# Patient Record
Sex: Female | Born: 1990 | Race: Black or African American | Hispanic: No | Marital: Single | Smoking: Never smoker
Health system: Southern US, Community
[De-identification: ages and names within clinical notes are randomized; demographics above are authoritative.]

## PROBLEM LIST (undated history)

## (undated) ENCOUNTER — Inpatient Hospital Stay (HOSPITAL_COMMUNITY): Payer: Self-pay

## (undated) DIAGNOSIS — J302 Other seasonal allergic rhinitis: Secondary | ICD-10-CM

## (undated) DIAGNOSIS — G43909 Migraine, unspecified, not intractable, without status migrainosus: Secondary | ICD-10-CM

## (undated) DIAGNOSIS — B977 Papillomavirus as the cause of diseases classified elsewhere: Secondary | ICD-10-CM

## (undated) HISTORY — DX: Papillomavirus as the cause of diseases classified elsewhere: B97.7

## (undated) HISTORY — PX: BACK SURGERY: SHX140

---
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
## 2012-01-19 ENCOUNTER — Emergency Department (HOSPITAL_COMMUNITY): Payer: Medicaid Other

## 2012-01-19 ENCOUNTER — Emergency Department (HOSPITAL_COMMUNITY)
Admission: EM | Admit: 2012-01-19 | Discharge: 2012-01-19 | Disposition: A | Discharge status: Home / Self Care | Payer: Medicaid Other | Attending: Emergency Medicine | Admitting: Emergency Medicine

## 2012-01-19 ENCOUNTER — Encounter (HOSPITAL_COMMUNITY): Payer: Self-pay | Admitting: Emergency Medicine

## 2012-01-19 DIAGNOSIS — R51 Headache: Secondary | ICD-10-CM

## 2012-01-19 DIAGNOSIS — J45909 Unspecified asthma, uncomplicated: Secondary | ICD-10-CM | POA: Insufficient documentation

## 2012-01-19 DIAGNOSIS — S0990XA Unspecified injury of head, initial encounter: Secondary | ICD-10-CM | POA: Insufficient documentation

## 2012-01-19 DIAGNOSIS — M549 Dorsalgia, unspecified: Secondary | ICD-10-CM

## 2012-01-19 DIAGNOSIS — IMO0002 Reserved for concepts with insufficient information to code with codable children: Secondary | ICD-10-CM

## 2012-01-19 DIAGNOSIS — T7491XA Unspecified adult maltreatment, confirmed, initial encounter: Secondary | ICD-10-CM | POA: Insufficient documentation

## 2012-01-19 LAB — PREGNANCY, URINE: Preg Test, Ur: NEGATIVE

## 2012-01-19 MED ORDER — OXYCODONE-ACETAMINOPHEN 5-325 MG PO TABS
2.0000 | ORAL_TABLET | Freq: Once | ORAL | Status: AC
  Administered 2012-01-19: 2 via ORAL
  Filled 2012-01-19: qty 2

## 2012-01-19 MED ORDER — IBUPROFEN 600 MG PO TABS: 600.0000 mg | ORAL_TABLET | Freq: Four times a day (QID) | ORAL | Status: AC | PRN

## 2012-01-19 MED ORDER — OXYCODONE-ACETAMINOPHEN 5-325 MG PO TABS: 2.0000 | ORAL_TABLET | Freq: Four times a day (QID) | ORAL | Status: AC | PRN

## 2012-01-19 MED ORDER — HYDROMORPHONE HCL PF 1 MG/ML IJ SOLN
1.0000 mg | Freq: Once | INTRAMUSCULAR | Status: AC
  Administered 2012-01-19: 1 mg via INTRAMUSCULAR
  Filled 2012-01-19: qty 1

## 2012-01-19 MED ORDER — TETANUS-DIPHTH-ACELL PERTUSSIS 5-2.5-18.5 LF-MCG/0.5 IM SUSP
0.5000 mL | Freq: Once | INTRAMUSCULAR | Status: AC
  Administered 2012-01-19: 0.5 mL via INTRAMUSCULAR
  Filled 2012-01-19: qty 0.5

## 2012-01-19 MED ORDER — IBUPROFEN 200 MG PO TABS
600.0000 mg | ORAL_TABLET | Freq: Once | ORAL | Status: AC
  Administered 2012-01-19: 600 mg via ORAL
  Filled 2012-01-19: qty 3
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
## 2012-01-20 NOTE — ED Provider Notes (Signed)
I saw and evaluated the patient, reviewed the resident's note and I agree with the findings and plan.   Dione Booze, MD 01/20/12 1459

## 2012-03-11 ENCOUNTER — Other Ambulatory Visit: Payer: Self-pay | Admitting: Obstetrics

## 2012-08-14 LAB — PROCEDURE REPORT - SCANNED: Pap: NEGATIVE

## 2013-05-01 ENCOUNTER — Encounter (HOSPITAL_COMMUNITY): Payer: Self-pay | Admitting: Emergency Medicine

## 2013-05-01 ENCOUNTER — Emergency Department (HOSPITAL_COMMUNITY)
Admission: EM | Admit: 2013-05-01 | Discharge: 2013-05-01 | Disposition: A | Discharge status: Home / Self Care | Payer: Medicaid Other | Attending: Emergency Medicine | Admitting: Emergency Medicine

## 2013-05-01 ENCOUNTER — Emergency Department (HOSPITAL_COMMUNITY): Payer: Medicaid Other

## 2013-05-01 DIAGNOSIS — S59919A Unspecified injury of unspecified forearm, initial encounter: Secondary | ICD-10-CM | POA: Insufficient documentation

## 2013-05-01 DIAGNOSIS — Z79899 Other long term (current) drug therapy: Secondary | ICD-10-CM | POA: Insufficient documentation

## 2013-05-01 DIAGNOSIS — R209 Unspecified disturbances of skin sensation: Secondary | ICD-10-CM | POA: Insufficient documentation

## 2013-05-01 DIAGNOSIS — S59909A Unspecified injury of unspecified elbow, initial encounter: Secondary | ICD-10-CM | POA: Insufficient documentation

## 2013-05-01 DIAGNOSIS — J45909 Unspecified asthma, uncomplicated: Secondary | ICD-10-CM | POA: Insufficient documentation

## 2013-05-01 DIAGNOSIS — M25531 Pain in right wrist: Secondary | ICD-10-CM

## 2013-05-01 DIAGNOSIS — S6990XA Unspecified injury of unspecified wrist, hand and finger(s), initial encounter: Secondary | ICD-10-CM | POA: Insufficient documentation

## 2013-05-01 MED ORDER — MORPHINE SULFATE 4 MG/ML IJ SOLN
4.0000 mg | Freq: Once | INTRAMUSCULAR | Status: AC
  Administered 2013-05-01: 4 mg via INTRAMUSCULAR
  Filled 2013-05-01: qty 1

## 2013-05-01 MED ORDER — PROMETHAZINE HCL 25 MG PO TABS: 25.0000 mg | ORAL_TABLET | Freq: Four times a day (QID) | ORAL | Status: DC | PRN

## 2013-05-01 MED ORDER — ONDANSETRON 8 MG PO TBDP
8.0000 mg | ORAL_TABLET | Freq: Once | ORAL | Status: AC
  Administered 2013-05-01: 8 mg via ORAL
  Filled 2013-05-01: qty 1

## 2013-05-01 MED ORDER — HYDROCODONE-ACETAMINOPHEN 5-325 MG PO TABS: 2.0000 | ORAL_TABLET | Freq: Four times a day (QID) | ORAL | Status: DC | PRN
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
## 2013-05-02 NOTE — ED Provider Notes (Signed)
Medical screening examination/treatment/procedure(s) were performed by non-physician practitioner and as supervising physician I was immediately available for consultation/collaboration.   Laray Anger, DO 05/02/13 [phone]

## 2013-07-22 ENCOUNTER — Emergency Department (HOSPITAL_COMMUNITY)
Admission: EM | Admit: 2013-07-22 | Discharge: 2013-07-23 | Disposition: A | Discharge status: Home / Self Care | Payer: Self-pay | Attending: Emergency Medicine | Admitting: Emergency Medicine

## 2013-07-22 ENCOUNTER — Encounter (HOSPITAL_COMMUNITY): Payer: Self-pay | Admitting: Emergency Medicine

## 2013-07-22 DIAGNOSIS — H538 Other visual disturbances: Secondary | ICD-10-CM | POA: Insufficient documentation

## 2013-07-22 DIAGNOSIS — J45909 Unspecified asthma, uncomplicated: Secondary | ICD-10-CM | POA: Insufficient documentation

## 2013-07-22 DIAGNOSIS — L03213 Periorbital cellulitis: Secondary | ICD-10-CM

## 2013-07-22 DIAGNOSIS — H05019 Cellulitis of unspecified orbit: Secondary | ICD-10-CM | POA: Insufficient documentation

## 2013-07-22 MED ORDER — VANCOMYCIN HCL IN DEXTROSE 1-5 GM/200ML-% IV SOLN
1000.0000 mg | Freq: Once | INTRAVENOUS | Status: AC
  Administered 2013-07-23: 1000 mg via INTRAVENOUS
  Filled 2013-07-22: qty 200

## 2013-07-22 MED ORDER — TETRACAINE HCL 0.5 % OP SOLN
2.0000 [drp] | Freq: Once | OPHTHALMIC | Status: AC
  Administered 2013-07-22: 2 [drp] via OPHTHALMIC
  Filled 2013-07-22: qty 2

## 2013-07-22 MED ORDER — DEXTROSE 5 % IV SOLN
1.0000 g | Freq: Once | INTRAVENOUS | Status: AC
  Administered 2013-07-23: 1 g via INTRAVENOUS
  Filled 2013-07-22: qty 10

## 2013-07-22 NOTE — ED Notes (Signed)
Patient adamantly refuses IV abx. Agrees to stay for CT scan but nothing else. Patient will not be transferred to another bed and will remain in FT room #4. Heather, PA notified and at bedside. Explained to patient that she could possibly lose her vision to the right eye if she refuses treatment. Patient still refuses IV abx.

## 2013-07-22 NOTE — ED Notes (Signed)
Updated patient on plan of care. Patient states that she is tired of waiting is about to "go off on your ass" referring to RN. Apologized to patient about delay and informed her that she should stay to have the ordered CT scan so that they can determine what's wrong with her eye. Patient continues to speak obscenities and RN left the room.

## 2013-07-22 NOTE — ED Provider Notes (Signed)
This chart was scribed for Magnus Sinning PA-C, a non-physician practitioner working with Vida Roller, MD by Lewanda Rife, ED Scribe. This patient was seen in room TR04C/TR04C and the patient's care was started at 2246.    CSN: [id number]     Arrival date & time 07/22/13  2020 History   First MD Initiated Contact with Patient 07/22/13 2120     Chief Complaint  Patient presents with  . Eye Problem   (Consider location/radiation/quality/duration/timing/severity/associated sxs/prior Treatment) The history is provided by the patient.   HPI Comments: Lisa Hickman is a 22 y.o. female who presents to the Emergency Department complaining of significant right periorbital eye swelling onset this morning. Reports associated intermittent moderate right eye pain. Denies any injury or trauma of the eye.   Denies any discharge from the eye.  Denies use of contacts or glasses. Denies associated visual disturbances, fever, chills, difficulty breathing, dysphagia, known foreign body of the eye.  Reports right eye pain is exacerbated by touch and EOMs. Denies hx of the same. Reports taking 2 Benadryl tablets and 1 Tylenol this morning with no relief of symptoms.  Denies recent sinus infection.     Past Medical History  Diagnosis Date  . Asthma    History reviewed. No pertinent past surgical history. No family history on file. History  Substance Use Topics  . Smoking status: Never Smoker   . Smokeless tobacco: Not on file  . Alcohol Use: No   OB History   Grav Para Term Preterm Abortions TAB SAB Ect Mult Living                 Review of Systems  Eyes: Positive for visual disturbance. Negative for discharge.       Eye swelling    A complete 10 system review of systems was obtained and all systems are negative except as noted in the HPI and PMH.   Allergies  Review of patient's allergies indicates no known allergies.  Home Medications   Current Outpatient Rx  Name  Route  Sig   Dispense  Refill  . medroxyPROGESTERone (DEPO-PROVERA) 150 MG/ML injection   Intramuscular   Inject 150 mg into the muscle every 3 (three) months.          BP 115/73  Pulse 90  Temp(Src) 98.2 F (36.8 C) (Oral)  Resp 18  Ht 5\' 7"  (1.702 m)  Wt 220 lb (99.791 kg)  BMI 34.45 kg/m2  SpO2 100% Physical Exam  Nursing note and vitals reviewed. Constitutional: She is oriented to person, place, and time. She appears well-developed and well-nourished. No distress.  HENT:  Head: Normocephalic and atraumatic.  Eyes: Conjunctivae and EOM are normal. Pupils are equal, round, and reactive to light. Right eye exhibits no discharge and no exudate. Right conjunctiva is not injected. Right conjunctiva has no hemorrhage. Left conjunctiva is not injected. Left conjunctiva has no hemorrhage.  Right eye pain with lateral EOM Significant periorbital edema of the right eye No proptosis  Ocular pressure  Right eye: 31, repeat 27 Left eye: 21   Visual acuity:  Uncorrected  Right eye: 20/50 Left eye: 20/20  No indirect or direct photophobia    Neck: Neck supple. No tracheal deviation present.  Cardiovascular: Normal rate.   Pulmonary/Chest: Effort normal. No respiratory distress.  Musculoskeletal: Normal range of motion.  Neurological: She is alert and oriented to person, place, and time.  Skin: Skin is warm and dry.  Psychiatric: She has a normal  mood and affect. Her behavior is normal.    ED Course  Procedures (including critical care time) Medications  tetracaine (PONTOCAINE) 0.5 % ophthalmic solution 2 drop (2 drops Right Eye Given by Other 07/22/13 2322)    Labs Review Labs Reviewed - No data to display Imaging Review No results found. 12:00 AM Patient signed out to Ivonne Andrew, PA-C at shift change.  CT orbits pending to rule out Orbital Cellulitis.   MDM  No diagnosis found. Patient presenting with right periorbital swelling onset earlier today.  Patient is afebrile and  nontoxic appearing.  She did have some increased pain during my exam with EOM.  CT orbits ordered to rule out Orbital Cellulitis.  Ivonne Andrew, PA-C will follow up on the results.  I personally performed the services described in this documentation, which was scribed in my presence. The recorded information has been reviewed and is accurate.    Pascal [place], PA-C 07/23/13 1127

## 2013-07-22 NOTE — ED Notes (Signed)
Pt. Stated, I woke up this am, and my rt. Eye was swollen. Unknown cause.  I took 2 Benadryl at 1200, and 2 at 200pm. And took a Tylenol at 5

## 2013-07-22 NOTE — ED Notes (Signed)
Patient requiring additional imaging and IV abx. Increased acuity; report given and care transferred to [facility], [place].

## 2013-07-22 NOTE — ED Notes (Signed)
Introduced myself to pt, attempted to explain we would be moving her to another area so she could receive her antibiotics, pt loudly stated she would not be getting an IV and is refusing antibiotics.  She stated she has been waiting too long, she has a job and if she needs antibiotics she would just have to come back another day to get them.  Attempted to explain the rationale behind antibiotics, etc. Pt was not willing to listen to reason.  The only treatment she will allow [place] to do now is a CT scan.  Conversation also witnessed by mid level provider.

## 2013-07-22 NOTE — ED Provider Notes (Signed)
22 year old female, history of one day of right eye swelling and pain. She denies discharge crusting or purulence. She has mild pain in the eye which on my exam does not seem to get worse with eye movements. She has periorbital edema but no erythema or induration, no conjunctival erythema, no ciliary flare and normal pupillary exam. Intraorbital pressure is measured by physician assistant is essentially normal measured at 27 in the right eye. There is no fevers, no upper respiratory symptoms, no sinusitis.  Medical screening examination/treatment/procedure(s) were conducted as a shared visit with non-physician practitioner(s) and myself.  I personally evaluated the patient during the encounter.  Clinical Impression: Periorbital swelling  Lisa Roller, MD 07/22/13 2332

## 2013-07-23 ENCOUNTER — Encounter (HOSPITAL_COMMUNITY): Payer: Self-pay | Admitting: Radiology

## 2013-07-23 ENCOUNTER — Emergency Department (HOSPITAL_COMMUNITY): Payer: Medicaid Other

## 2013-07-23 LAB — CBC WITH DIFFERENTIAL/PLATELET
Basophils Absolute: 0.1 10*3/uL (ref 0.0–0.1)
Basophils Relative: 0 % (ref 0–1)
Eosinophils Absolute: 0 10*3/uL (ref 0.0–0.7)
Eosinophils Relative: 0 % (ref 0–5)
HCT: 42.8 % (ref 36.0–46.0)
Hemoglobin: 14.1 g/dL (ref 12.0–15.0)
Lymphocytes Relative: 34 % (ref 12–46)
Lymphs Abs: 4.5 10*3/uL — ABNORMAL HIGH (ref 0.7–4.0)
MCH: 29.2 pg (ref 26.0–34.0)
MCHC: 32.9 g/dL (ref 30.0–36.0)
MCV: 88.6 fL (ref 78.0–100.0)
Monocytes Absolute: 0.6 10*3/uL (ref 0.1–1.0)
Monocytes Relative: 4 % (ref 3–12)
Neutro Abs: 8.3 10*3/uL — ABNORMAL HIGH (ref 1.7–7.7)
Neutrophils Relative %: 62 % (ref 43–77)
Platelets: 286 10*3/uL (ref 150–400)
RBC: 4.83 MIL/uL (ref 3.87–5.11)
RDW: 12.8 % (ref 11.5–15.5)
WBC: 13.5 10*3/uL — ABNORMAL HIGH (ref 4.0–10.5)

## 2013-07-23 LAB — POCT I-STAT, CHEM 8
BUN: 10 mg/dL (ref 6–23)
Calcium, Ion: 1.19 mmol/L (ref 1.12–1.23)
Chloride: 105 mEq/L (ref 96–112)
Creatinine, Ser: 1.1 mg/dL (ref 0.50–1.10)
Glucose, Bld: 87 mg/dL (ref 70–99)
HCT: 46 % (ref 36.0–46.0)
Hemoglobin: 15.6 g/dL — ABNORMAL HIGH (ref 12.0–15.0)
Potassium: 3.7 mEq/L (ref 3.5–5.1)
Sodium: 144 mEq/L (ref 135–145)
TCO2: 26 mmol/L (ref 0–100)

## 2013-07-23 MED ORDER — IOHEXOL 300 MG/ML  SOLN
100.0000 mL | Freq: Once | INTRAMUSCULAR | Status: AC | PRN
  Administered 2013-07-23: 100 mL via INTRAVENOUS

## 2013-07-23 MED ORDER — CLINDAMYCIN HCL 300 MG PO CAPS: 300.0000 mg | ORAL_CAPSULE | Freq: Four times a day (QID) | ORAL | Status: DC

## 2013-07-23 NOTE — ED Notes (Signed)
Patient transported to CT 

## 2013-07-23 NOTE — ED Notes (Signed)
Informed CT that patient now has an IV established and is ready for scan

## 2013-07-23 NOTE — ED Notes (Signed)
Patient to provider's desk inquiring how long it will take for CT scan. Informed patient that she needs an IV in order to have CT scan. Patient is now much calmer than before. RN able to re-explain the importance of her receiving prescribed care. Patient states that she has to go to work at 6 am and her refusal was d/t not wanting to be here "all night." told patient that we can start 2 IVs, start the Vancomycin (which takes 1 hour to infuse) and leave the other IV available for CT scan. After returning from CT, we will use the second IV to infuse the Rocephin (which will take 30 minutes to infuse). Told patient that CT should be resulted by the time both abx have infused. Patient agreeable to plan.

## 2013-07-23 NOTE — ED Provider Notes (Signed)
Medical screening examination/treatment/procedure(s) were performed by non-physician practitioner and as supervising physician I was immediately available for consultation/collaboration.   Lyanne Co, MD 07/23/13 [phone]

## 2013-07-23 NOTE — ED Provider Notes (Signed)
Lisa Hickman S 12:00AM patient discussed in sign out. Patient with significant swelling around the right high pending labs and a CT to rule out orbital cellulitis. Antibiotics have been started.  Patient well appearing nontoxic. She does have significant swelling around the right eye. Labs show slightly elevated WBC. CT results without any concerning findings for orbital cellulitis. Will discharge patient on a prescription for clindamycin and given strict return precautions for recheck in 48 hours. Patient agrees with this plan.  Angus Seller, PA-C 07/23/13 [phone]

## 2013-07-24 NOTE — ED Provider Notes (Signed)
Medical screening examination/treatment/procedure(s) were conducted as a shared visit with non-physician practitioner(s) and myself.  I personally evaluated the patient during the encounter  Please see my separate respective documentation pertaining to this patient encounter       Vida Roller, MD 07/24/13 [phone]

## 2013-10-01 ENCOUNTER — Encounter (HOSPITAL_COMMUNITY): Payer: Self-pay | Admitting: Emergency Medicine

## 2013-10-01 ENCOUNTER — Emergency Department (HOSPITAL_COMMUNITY)
Admission: EM | Admit: 2013-10-01 | Discharge: 2013-10-01 | Disposition: A | Discharge status: Home / Self Care | Payer: Medicaid Other | Attending: Emergency Medicine | Admitting: Emergency Medicine

## 2013-10-01 DIAGNOSIS — N939 Abnormal uterine and vaginal bleeding, unspecified: Secondary | ICD-10-CM | POA: Insufficient documentation

## 2013-10-01 DIAGNOSIS — J45909 Unspecified asthma, uncomplicated: Secondary | ICD-10-CM | POA: Insufficient documentation

## 2013-10-01 DIAGNOSIS — R5381 Other malaise: Secondary | ICD-10-CM | POA: Insufficient documentation

## 2013-10-01 DIAGNOSIS — N926 Irregular menstruation, unspecified: Secondary | ICD-10-CM | POA: Insufficient documentation

## 2013-10-01 LAB — POCT I-STAT, CHEM 8
BUN: 9 mg/dL (ref 6–23)
Calcium, Ion: 1.19 mmol/L (ref 1.12–1.23)
Chloride: 103 mEq/L (ref 96–112)
Creatinine, Ser: 1.1 mg/dL (ref 0.50–1.10)
Glucose, Bld: 90 mg/dL (ref 70–99)
HCT: 39 % (ref 36.0–46.0)
Hemoglobin: 13.3 g/dL (ref 12.0–15.0)
Potassium: 3.8 mEq/L (ref 3.5–5.1)
Sodium: 141 mEq/L (ref 135–145)
TCO2: 27 mmol/L (ref 0–100)

## 2013-10-01 LAB — URINALYSIS, ROUTINE W REFLEX MICROSCOPIC
Bilirubin Urine: NEGATIVE
Glucose, UA: NEGATIVE mg/dL
Ketones, ur: NEGATIVE mg/dL
Leukocytes, UA: NEGATIVE
Nitrite: NEGATIVE
Protein, ur: NEGATIVE mg/dL
Specific Gravity, Urine: 1.03 (ref 1.005–1.030)
Urobilinogen, UA: 1 mg/dL (ref 0.0–1.0)
pH: 6 (ref 5.0–8.0)

## 2013-10-01 LAB — URINE MICROSCOPIC-ADD ON

## 2013-10-01 LAB — HCG, SERUM, QUALITATIVE: Preg, Serum: NEGATIVE

## 2013-10-01 MED ORDER — FERROUS SULFATE 325 (65 FE) MG PO TABS: 325.0000 mg | ORAL_TABLET | Freq: Every day | ORAL | Status: DC

## 2013-10-01 MED ORDER — MEGESTROL ACETATE 40 MG PO TABS: ORAL_TABLET | ORAL | Status: DC

## 2013-10-01 NOTE — ED Notes (Signed)
Dr. Rancour at bedside. 

## 2013-10-01 NOTE — ED Notes (Signed)
Pt. reports vaginal spotting / bleeding for 1 month , denies injury , no dysuria / fever or chills.

## 2013-10-01 NOTE — ED Notes (Signed)
Dr. Lebron Quam performing pelvic exam. This RN as chaperone.

## 2013-10-01 NOTE — ED Notes (Signed)
Pt A&Ox4, ambulatory upon discharge with slow, steady gait. No acute distress noted, verbalizing no complaints and thanking staff for help.

## 2013-10-01 NOTE — ED Provider Notes (Addendum)
CSN: [id number]     Arrival date & time 10/01/13  0027 History   First MD Initiated Contact with Patient 10/01/13 0136     Chief Complaint  Patient presents with  . Vaginal Bleeding   (Consider location/radiation/quality/duration/timing/severity/associated sxs/prior Treatment) HPI Patient with known history of menstrual irregularity presents with vaginal bleeding for one month. She states she uses up to 10 pads a day. She denies any lightheadedness or dizziness. She has no shortness of breath. She does have episodic abdominal cramping but she is having none at this point. She has no fevers or chills. She denies any urinary symptoms. She has not seen an OB/GYN in 6 months. She denies any type of vaginal discharge. Past Medical History  Diagnosis Date  . Asthma    History reviewed. No pertinent past surgical history. No family history on file. History  Substance Use Topics  . Smoking status: Never Smoker   . Smokeless tobacco: Not on file  . Alcohol Use: No   OB History   Grav Para Term Preterm Abortions TAB SAB Ect Mult Living                 Review of Systems  Constitutional: Positive for fatigue. Negative for fever and chills.  Respiratory: Negative for shortness of breath.   Cardiovascular: Negative for chest pain, palpitations and leg swelling.  Gastrointestinal: Negative for nausea, vomiting, abdominal pain, diarrhea and constipation.  Genitourinary: Positive for vaginal bleeding. Negative for dysuria, frequency, hematuria, flank pain, vaginal discharge, vaginal pain and pelvic pain.  Musculoskeletal: Negative for back pain.  Skin: Negative for rash and wound.  Neurological: Negative for dizziness, weakness, light-headedness, numbness and headaches.  All other systems reviewed and are negative.    Allergies  Review of patient's allergies indicates no known allergies.  Home Medications   Current Outpatient Rx  Name  Route  Sig  Dispense  Refill  . Acetaminophen  (TYLENOL PO)   Oral   Take 2 tablets by mouth every 8 (eight) hours as needed (for pain).         . medroxyPROGESTERone (DEPO-PROVERA) 150 MG/ML injection   Intramuscular   Inject 150 mg into the muscle every 3 (three) months.          BP 129/86  Pulse 100  Resp 18  SpO2 98% Physical Exam  Nursing note and vitals reviewed. Constitutional: She is oriented to person, place, and time. She appears well-developed and well-nourished. No distress.  HENT:  Head: Normocephalic and atraumatic.  Mouth/Throat: Oropharynx is clear and moist.  Eyes: EOM are normal. Pupils are equal, round, and reactive to light.  Neck: Normal range of motion. Neck supple.  Cardiovascular: Normal rate and regular rhythm.   Pulmonary/Chest: Effort normal and breath sounds normal. No respiratory distress. She has no wheezes. She has no rales.  Abdominal: Soft. Bowel sounds are normal. She exhibits no distension and no mass. There is no tenderness. There is no rebound and no guarding.  Genitourinary:  Mild-to-moderate amount of blood in the vaginal vault with clotting. Cervical os is closed.  Musculoskeletal: Normal range of motion. She exhibits no edema and no tenderness.  Neurological: She is alert and oriented to person, place, and time.  Patient is alert and oriented x3 with clear, goal oriented speech. Patient has 5/5 motor in all extremities. Sensation is intact to light touch. Patient has a normal gait and walks without assistance.   Skin: Skin is warm and dry. No rash noted. No erythema.  Psychiatric: She has a normal mood and affect. Her behavior is normal.    ED Course  Procedures (including critical care time) Labs Review Labs Reviewed  URINALYSIS, ROUTINE W REFLEX MICROSCOPIC - Abnormal; Notable for the following:    Color, Urine AMBER (*)    APPearance CLOUDY (*)    Hgb urine dipstick LARGE (*)    All other components within normal limits  URINE MICROSCOPIC-ADD ON  HCG, SERUM, QUALITATIVE   POCT I-STAT, CHEM 8   Imaging Review No results found.  EKG Interpretation   None       MDM   Patient seemed medically stable. She continues to have moderate amount of vaginal bleeding. Her hemoglobin is normal. I discussed with OB/GYN on call and they recommended starting the patient on Megace and followed up as an outpatient. Patient been given thorough discharge instructions and is to return for worsening bleeding, lightheadedness, shortness of breath, abdominal pain or for any concerns.  Loren Racer, MD 10/01/13 4098  Loren Racer, MD 10/01/13 [phone]

## 2013-10-04 ENCOUNTER — Emergency Department (HOSPITAL_COMMUNITY)
Admission: EM | Admit: 2013-10-04 | Discharge: 2013-10-04 | Disposition: A | Discharge status: Home / Self Care | Payer: Medicaid Other | Attending: Emergency Medicine | Admitting: Emergency Medicine

## 2013-10-04 ENCOUNTER — Encounter (HOSPITAL_COMMUNITY): Payer: Self-pay | Admitting: Emergency Medicine

## 2013-10-04 DIAGNOSIS — F17200 Nicotine dependence, unspecified, uncomplicated: Secondary | ICD-10-CM | POA: Insufficient documentation

## 2013-10-04 DIAGNOSIS — A499 Bacterial infection, unspecified: Secondary | ICD-10-CM | POA: Insufficient documentation

## 2013-10-04 DIAGNOSIS — Z3202 Encounter for pregnancy test, result negative: Secondary | ICD-10-CM | POA: Insufficient documentation

## 2013-10-04 DIAGNOSIS — N898 Other specified noninflammatory disorders of vagina: Secondary | ICD-10-CM | POA: Insufficient documentation

## 2013-10-04 DIAGNOSIS — N760 Acute vaginitis: Secondary | ICD-10-CM | POA: Insufficient documentation

## 2013-10-04 DIAGNOSIS — Z79899 Other long term (current) drug therapy: Secondary | ICD-10-CM | POA: Insufficient documentation

## 2013-10-04 DIAGNOSIS — B9689 Other specified bacterial agents as the cause of diseases classified elsewhere: Secondary | ICD-10-CM | POA: Insufficient documentation

## 2013-10-04 DIAGNOSIS — N939 Abnormal uterine and vaginal bleeding, unspecified: Secondary | ICD-10-CM

## 2013-10-04 DIAGNOSIS — J45909 Unspecified asthma, uncomplicated: Secondary | ICD-10-CM | POA: Insufficient documentation

## 2013-10-04 LAB — BASIC METABOLIC PANEL
BUN: 10 mg/dL (ref 6–23)
CO2: 24 mEq/L (ref 19–32)
Calcium: 9.3 mg/dL (ref 8.4–10.5)
Chloride: 104 mEq/L (ref 96–112)
Creatinine, Ser: 0.97 mg/dL (ref 0.50–1.10)
GFR calc Af Amer: >90 mL/min (ref >90)
GFR calc non Af Amer: 84 mL/min — ABNORMAL LOW (ref >90)
Glucose, Bld: 85 mg/dL (ref 70–99)
Potassium: 3.9 mEq/L (ref 3.5–5.1)
Sodium: 137 mEq/L (ref 135–145)

## 2013-10-04 LAB — URINE MICROSCOPIC-ADD ON

## 2013-10-04 LAB — URINALYSIS, ROUTINE W REFLEX MICROSCOPIC
Glucose, UA: NEGATIVE mg/dL
Ketones, ur: 40 mg/dL — AB
Leukocytes, UA: NEGATIVE
Nitrite: NEGATIVE
Protein, ur: 30 mg/dL — AB
Specific Gravity, Urine: 1.032 — ABNORMAL HIGH (ref 1.005–1.030)
Urobilinogen, UA: 1 mg/dL (ref 0.0–1.0)
pH: 6 (ref 5.0–8.0)

## 2013-10-04 LAB — WET PREP, GENITAL
Trich, Wet Prep: NONE SEEN
Yeast Wet Prep HPF POC: NONE SEEN

## 2013-10-04 LAB — CBC WITH DIFFERENTIAL/PLATELET
Basophils Absolute: 0 10*3/uL (ref 0.0–0.1)
Basophils Relative: 0 % (ref 0–1)
Eosinophils Absolute: 0.1 10*3/uL (ref 0.0–0.7)
Eosinophils Relative: 1 % (ref 0–5)
HCT: 38.2 % (ref 36.0–46.0)
Hemoglobin: 12.8 g/dL (ref 12.0–15.0)
Lymphocytes Relative: 22 % (ref 12–46)
Lymphs Abs: 3 10*3/uL (ref 0.7–4.0)
MCH: 29.7 pg (ref 26.0–34.0)
MCHC: 33.5 g/dL (ref 30.0–36.0)
MCV: 88.6 fL (ref 78.0–100.0)
Monocytes Absolute: 0.7 10*3/uL (ref 0.1–1.0)
Monocytes Relative: 5 % (ref 3–12)
Neutro Abs: 9.5 10*3/uL — ABNORMAL HIGH (ref 1.7–7.7)
Neutrophils Relative %: 72 % (ref 43–77)
Platelets: 302 10*3/uL (ref 150–400)
RBC: 4.31 MIL/uL (ref 3.87–5.11)
RDW: 12.8 % (ref 11.5–15.5)
WBC: 13.2 10*3/uL — ABNORMAL HIGH (ref 4.0–10.5)

## 2013-10-04 LAB — PREGNANCY, URINE: Preg Test, Ur: NEGATIVE

## 2013-10-04 MED ORDER — METRONIDAZOLE 500 MG PO TABS: 500.0000 mg | ORAL_TABLET | Freq: Two times a day (BID) | ORAL | Status: DC

## 2013-10-04 MED ORDER — OXYCODONE-ACETAMINOPHEN 5-325 MG PO TABS
1.0000 | ORAL_TABLET | Freq: Once | ORAL | Status: AC
  Administered 2013-10-04: 1 via ORAL
  Filled 2013-10-04: qty 1

## 2013-10-04 MED ORDER — IBUPROFEN 800 MG PO TABS: 800.0000 mg | ORAL_TABLET | Freq: Three times a day (TID) | ORAL | Status: DC

## 2013-10-04 NOTE — ED Notes (Signed)
Patient is alert and oriented x3.  She is complaining of lower abdominal pain that start last week.  She was seen at [facility] for this issue but continues to have pain that is uncontrollable.  Currently  She rates her pain 8 of 10 with lightheadedness and dizziness

## 2013-10-04 NOTE — ED Provider Notes (Signed)
Medical screening examination/treatment/procedure(s) were performed by non-physician practitioner and as supervising physician I was immediately available for consultation/collaboration.  EKG Interpretation   None        Orean Giarratano, MD 10/04/13 2216 

## 2013-10-04 NOTE — ED Notes (Signed)
Pt upset she could not get work note for yesterday. PA spoke with pt. Pt refusing discharge vitals

## 2013-10-04 NOTE — ED Provider Notes (Signed)
CSN: [id number]     Arrival date & time 10/04/13  0541 History   None    Chief Complaint  Patient presents with  . Abdominal Pain   (Consider location/radiation/quality/duration/timing/severity/associated sxs/prior Treatment) The history is provided by the patient and medical records.   This is a 22 year old female with a history significant for asthma, presenting to the ED for bilateral lower abdominal pain x1 week. Patient was seen at [facility] ED for the same on 10/01/2013 but states symptoms have persisted. Pain described as lower abdominal cramping with intermittent sharp sensations. She continues to have vaginal bleeding with small clots, but states it has lightened from previous visit.  States her menstrual cycles have never been this long or irregular.  Denies any associated other nausea, vomiting, or diarrhea. She does note some intermittent episodes of lightheadedness and dizziness.  No syncopal events.  Denies any urinary sx or vaginal discharge.  No prior hx of fibroids or ovarian cysts.  Pt was referred to OB-GYN, no follow up appt was made.  Past Medical History  Diagnosis Date  . Asthma    History reviewed. No pertinent past surgical history. History reviewed. No pertinent family history. History  Substance Use Topics  . Smoking status: Current Every Day Smoker  . Smokeless tobacco: Not on file  . Alcohol Use: Yes   OB History   Grav Para Term Preterm Abortions TAB SAB Ect Mult Living                 Review of Systems  Gastrointestinal: Positive for abdominal pain.  Genitourinary: Positive for menstrual problem.  All other systems reviewed and are negative.    Allergies  Review of patient's allergies indicates no known allergies.  Home Medications   Current Outpatient Rx  Name  Route  Sig  Dispense  Refill  . Acetaminophen (TYLENOL PO)   Oral   Take 2 tablets by mouth every 8 (eight) hours as needed (for pain).         . ferrous sulfate 325 (65 FE) MG  tablet   Oral   Take 1 tablet (325 mg total) by mouth daily.   30 tablet   0   . megestrol (MEGACE) 40 MG tablet      Take 40 mg TID x 5 days, then 40 mg BID x 5 days, then 40 mg daily   45 tablet   0    BP 114/74  Pulse 92  Temp(Src) 98 F (36.7 C) (Oral)  Resp 18  SpO2 99%  Physical Exam  Nursing note and vitals reviewed. Constitutional: She is oriented to person, place, and time. She appears well-developed and well-nourished. No distress.  HENT:  Head: Normocephalic and atraumatic.  Mouth/Throat: Oropharynx is clear and moist.  Eyes: Conjunctivae and EOM are normal. Pupils are equal, round, and reactive to light.  Neck: Normal range of motion. Neck supple.  Cardiovascular: Normal rate, regular rhythm and normal heart sounds.   Pulmonary/Chest: Effort normal and breath sounds normal. No respiratory distress. She has no wheezes.  Abdominal: Soft. Bowel sounds are normal. There is no tenderness. There is no guarding.  Abdomen soft, non-distended, no focal TTP  Genitourinary: There is no lesion on the right labia. There is no lesion on the left labia. Cervix exhibits no motion tenderness. Right adnexum displays no tenderness. Left adnexum displays no tenderness. There is bleeding around the vagina. No vaginal discharge found.  Small amount of vaginal bleeding without clots; no discharge noted; no  adnexal or CMT  Musculoskeletal: Normal range of motion.  Neurological: She is alert and oriented to person, place, and time. She has normal strength. She displays no tremor. No cranial nerve deficit or sensory deficit. She displays no seizure activity. Gait normal.  No focal neuro deficits appreciated; non-ataxic gait  Skin: Skin is warm and dry. She is not diaphoretic.  Psychiatric: She has a normal mood and affect.    ED Course  Procedures (including critical care time) Labs Review Labs Reviewed - No data to display Imaging Review No results found.  EKG Interpretation    None       MDM   1. Bacterial vaginosis   2. Vaginal bleeding    Upon entering pts room she is asleep, in NAD.  Today her abdominal exam is benign.  Notes reviewed from previous visit-- pt was instructed to FU with OB-GYN, states she has been taking the medication they recommended but does not have an appt scheduled.  H/H stable from previous visit, no Gc/Chl or wet prep in system.  Will check basic labs, repeat pelvic.  Pain meds given.  Will reassess.  Labs as above, H/H again stable.  Wet prep with few clue cells, Gc/Chl pending.  Pt has ambulated around the ED with non-ataxic gait or signs/sx concerning for syncope or near syncope.  I doubt acute/surgical abdomen at this time including, but not limited to, ovarian torsion, TOA, diverticulitis, appendicitis.  Again instructed to FU with OB-GYN, referral given.  Rx flagyl and motrin.  Discussed plan with pt, she agreed.  Return precautions advised.  Garlon Hatchet, PA-C 10/04/13 1223

## 2013-10-04 NOTE — ED Notes (Signed)
Pt refused prescription for pain.

## 2013-10-05 LAB — GC/CHLAMYDIA PROBE AMP
CT Probe RNA: NEGATIVE
GC Probe RNA: NEGATIVE

## 2014-02-24 ENCOUNTER — Encounter (HOSPITAL_COMMUNITY): Payer: Self-pay | Admitting: Emergency Medicine

## 2014-02-24 ENCOUNTER — Emergency Department (HOSPITAL_COMMUNITY): Payer: Medicaid Other

## 2014-02-24 ENCOUNTER — Emergency Department (HOSPITAL_COMMUNITY)
Admission: EM | Admit: 2014-02-24 | Discharge: 2014-02-25 | Disposition: A | Discharge status: Home / Self Care | Payer: Medicaid Other | Attending: Emergency Medicine | Admitting: Emergency Medicine

## 2014-02-24 DIAGNOSIS — J45909 Unspecified asthma, uncomplicated: Secondary | ICD-10-CM | POA: Insufficient documentation

## 2014-02-24 DIAGNOSIS — Z791 Long term (current) use of non-steroidal anti-inflammatories (NSAID): Secondary | ICD-10-CM | POA: Insufficient documentation

## 2014-02-24 DIAGNOSIS — F17200 Nicotine dependence, unspecified, uncomplicated: Secondary | ICD-10-CM | POA: Insufficient documentation

## 2014-02-24 DIAGNOSIS — Z23 Encounter for immunization: Secondary | ICD-10-CM | POA: Insufficient documentation

## 2014-02-24 DIAGNOSIS — S1190XA Unspecified open wound of unspecified part of neck, initial encounter: Secondary | ICD-10-CM | POA: Insufficient documentation

## 2014-02-24 DIAGNOSIS — W260XXA Contact with knife, initial encounter: Secondary | ICD-10-CM

## 2014-02-24 DIAGNOSIS — S21209A Unspecified open wound of unspecified back wall of thorax without penetration into thoracic cavity, initial encounter: Secondary | ICD-10-CM | POA: Insufficient documentation

## 2014-02-24 DIAGNOSIS — Z79899 Other long term (current) drug therapy: Secondary | ICD-10-CM | POA: Insufficient documentation

## 2014-02-24 LAB — BASIC METABOLIC PANEL
BUN: 7 mg/dL (ref 6–23)
CO2: 23 mEq/L (ref 19–32)
Calcium: 8.9 mg/dL (ref 8.4–10.5)
Chloride: 105 mEq/L (ref 96–112)
Creatinine, Ser: 0.94 mg/dL (ref 0.50–1.10)
GFR calc Af Amer: >90 mL/min (ref >90)
GFR calc non Af Amer: 86 mL/min — ABNORMAL LOW (ref >90)
Glucose, Bld: 103 mg/dL — ABNORMAL HIGH (ref 70–99)
Potassium: 4 mEq/L (ref 3.7–5.3)
Sodium: 139 mEq/L (ref 137–147)

## 2014-02-24 LAB — CBC WITH DIFFERENTIAL/PLATELET
Basophils Absolute: 0 10*3/uL (ref 0.0–0.1)
Basophils Relative: 0 % (ref 0–1)
Eosinophils Absolute: 0 10*3/uL (ref 0.0–0.7)
Eosinophils Relative: 0 % (ref 0–5)
HCT: 36.8 % (ref 36.0–46.0)
Hemoglobin: 12.3 g/dL (ref 12.0–15.0)
Lymphocytes Relative: 14 % (ref 12–46)
Lymphs Abs: 2 10*3/uL (ref 0.7–4.0)
MCH: 29.6 pg (ref 26.0–34.0)
MCHC: 33.4 g/dL (ref 30.0–36.0)
MCV: 88.7 fL (ref 78.0–100.0)
Monocytes Absolute: 0.6 10*3/uL (ref 0.1–1.0)
Monocytes Relative: 4 % (ref 3–12)
Neutro Abs: 11.5 10*3/uL — ABNORMAL HIGH (ref 1.7–7.7)
Neutrophils Relative %: 82 % — ABNORMAL HIGH (ref 43–77)
Platelets: 271 10*3/uL (ref 150–400)
RBC: 4.15 MIL/uL (ref 3.87–5.11)
RDW: 12.4 % (ref 11.5–15.5)
WBC: 14.2 10*3/uL — ABNORMAL HIGH (ref 4.0–10.5)

## 2014-02-24 LAB — TYPE AND SCREEN
ABO/RH(D): O POS
Antibody Screen: NEGATIVE

## 2014-02-24 MED ORDER — MORPHINE SULFATE 2 MG/ML IJ SOLN
INTRAMUSCULAR | Status: AC
  Filled 2014-02-24: qty 2

## 2014-02-24 MED ORDER — LIDOCAINE-EPINEPHRINE 1 %-1:100000 IJ SOLN
20.0000 mL | Freq: Once | INTRAMUSCULAR | Status: AC
  Administered 2014-02-25: 20 mL via INTRADERMAL

## 2014-02-24 MED ORDER — SODIUM CHLORIDE 0.9 % IV BOLUS (SEPSIS)
1000.0000 mL | Freq: Once | INTRAVENOUS | Status: AC
  Administered 2014-02-24: 1000 mL via INTRAVENOUS

## 2014-02-24 MED ORDER — TETANUS-DIPHTH-ACELL PERTUSSIS 5-2.5-18.5 LF-MCG/0.5 IM SUSP
0.5000 mL | Freq: Once | INTRAMUSCULAR | Status: AC
  Administered 2014-02-24: 0.5 mL via INTRAMUSCULAR
  Filled 2014-02-24: qty 0.5

## 2014-02-24 MED ORDER — LIDOCAINE-EPINEPHRINE 1 %-1:100000 IJ SOLN
20.0000 mL | Freq: Once | INTRAMUSCULAR | Status: AC
  Administered 2014-02-24: 1 mL via INTRADERMAL
  Filled 2014-02-24: qty 1

## 2014-02-24 MED ORDER — MORPHINE SULFATE 4 MG/ML IJ SOLN: 4.0000 mg | Freq: Once | INTRAMUSCULAR | Status: AC

## 2014-02-24 MED ORDER — OXYCODONE-ACETAMINOPHEN 5-325 MG PO TABS
1.0000 | ORAL_TABLET | Freq: Once | ORAL | Status: AC
  Administered 2014-02-24: 1 via ORAL
  Filled 2014-02-24: qty 1

## 2014-02-24 MED ORDER — MORPHINE SULFATE 2 MG/ML IJ SOLN
INTRAMUSCULAR | Status: AC | PRN
  Administered 2014-02-24: 4 mg via INTRAVENOUS

## 2014-02-24 MED ORDER — MORPHINE SULFATE 4 MG/ML IJ SOLN
4.0000 mg | Freq: Once | INTRAMUSCULAR | Status: AC
  Administered 2014-02-24: 4 mg via INTRAVENOUS
  Filled 2014-02-24: qty 1

## 2014-02-24 MED ORDER — IOHEXOL 350 MG/ML SOLN
50.0000 mL | Freq: Once | INTRAVENOUS | Status: AC | PRN
  Administered 2014-02-24: 50 mL via INTRAVENOUS

## 2014-02-24 NOTE — ED Notes (Signed)
Pt talking with friends at bedside.  Warm blanket given to pt for comfort.

## 2014-02-24 NOTE — ED Notes (Signed)
Resident at bedside now performing sutures. Wounds appear to be deeper then originally thought. Pt tolerating sutures well.
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
## 2014-02-24 NOTE — ED Notes (Signed)
CT ready for pt.  Dr. Clearance CootsHarper st's he will finish suturing when pt returns.
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
## 2014-02-24 NOTE — ED Notes (Signed)
Pt returned from CT °

## 2014-02-24 NOTE — ED Notes (Signed)
Md Kohut at bedside.  

## 2014-02-24 NOTE — ED Notes (Signed)
Labs drawn, will go to CT when BUN and Creatine have resulted.  Pt resting quietly at this time.
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
## 2014-02-24 NOTE — ED Notes (Signed)
The pt is very hostile sometimes she only looks at you when  She is asked a question

## 2014-02-25 LAB — ABO/RH: ABO/RH(D): O POS

## 2014-02-25 MED ORDER — HYDROCODONE-ACETAMINOPHEN 5-325 MG PO TABS: 1.0000 | ORAL_TABLET | Freq: Four times a day (QID) | ORAL | Status: DC | PRN
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
## 2014-03-01 NOTE — ED Provider Notes (Signed)
I saw and evaluated the patient, reviewed the resident's note and I agree with the findings and plan.   EKG Interpretation None     Please see completed note.   Raeford RazorStephen Damyn Weitzel, MD 03/01/14 [phone]

## 2014-03-07 ENCOUNTER — Encounter (HOSPITAL_COMMUNITY): Payer: Self-pay | Admitting: Emergency Medicine

## 2014-03-07 ENCOUNTER — Emergency Department (INDEPENDENT_AMBULATORY_CARE_PROVIDER_SITE_OTHER)
Admission: EM | Admit: 2014-03-07 | Discharge: 2014-03-07 | Disposition: A | Discharge status: Home / Self Care | Payer: Self-pay | Source: Home / Self Care | Attending: Family Medicine | Admitting: Family Medicine

## 2014-03-07 DIAGNOSIS — S21219A Laceration without foreign body of unspecified back wall of thorax without penetration into thoracic cavity, initial encounter: Secondary | ICD-10-CM

## 2014-03-07 DIAGNOSIS — S21209A Unspecified open wound of unspecified back wall of thorax without penetration into thoracic cavity, initial encounter: Secondary | ICD-10-CM

## 2014-03-07 LAB — POCT PREGNANCY, URINE: Preg Test, Ur: NEGATIVE

## 2014-03-07 MED ORDER — BACITRACIN 500 UNIT/GM EX OINT: 1.0000 "application " | TOPICAL_OINTMENT | Freq: Two times a day (BID) | CUTANEOUS | Status: DC

## 2014-03-07 MED ORDER — BACITRACIN 500 UNIT/GM EX OINT
1.0000 "application " | TOPICAL_OINTMENT | Freq: Once | CUTANEOUS | Status: AC
  Administered 2014-03-07: 1 via TOPICAL

## 2014-03-07 MED ORDER — TRAMADOL HCL 50 MG PO TABS: 50.0000 mg | ORAL_TABLET | Freq: Four times a day (QID) | ORAL | Status: DC | PRN

## 2014-03-07 NOTE — Discharge Instructions (Signed)
Thank you for coming in today. Use tramadol for pain as needed.  Come back as needed.   Laceration Care, Adult A laceration is a cut or lesion that goes through all layers of the skin and into the tissue just beneath the skin. TREATMENT  Some lacerations may not require closure. Some lacerations may not be able to be closed due to an increased risk of infection. It is important to see your caregiver as soon as possible after an injury to minimize the risk of infection and maximize the opportunity for successful closure. If closure is appropriate, pain medicines may be given, if needed. The wound will be cleaned to help prevent infection. Your caregiver will use stitches (sutures), staples, wound glue (adhesive), or skin adhesive strips to repair the laceration. These tools bring the skin edges together to allow for faster healing and a better cosmetic outcome. However, all wounds will heal with a scar. Once the wound has healed, scarring can be minimized by covering the wound with sunscreen during the day for 1 full year. HOME CARE INSTRUCTIONS  For sutures or staples:  Keep the wound clean and dry.  If you were given a bandage (dressing), you should change it at least once a day. Also, change the dressing if it becomes wet or dirty, or as directed by your caregiver.  Wash the wound with soap and water 2 times a day. Rinse the wound off with water to remove all soap. Pat the wound dry with a clean towel.  After cleaning, apply a thin layer of the antibiotic ointment as recommended by your caregiver. This will help prevent infection and keep the dressing from sticking.  You may shower as usual after the first 24 hours. Do not soak the wound in water until the sutures are removed.  Only take over-the-counter or prescription medicines for pain, discomfort, or fever as directed by your caregiver.  Get your sutures or staples removed as directed by your caregiver. For skin adhesive  strips:  Keep the wound clean and dry.  Do not get the skin adhesive strips wet. You may bathe carefully, using caution to keep the wound dry.  If the wound gets wet, pat it dry with a clean towel.  Skin adhesive strips will fall off on their own. You may trim the strips as the wound heals. Do not remove skin adhesive strips that are still stuck to the wound. They will fall off in time. For wound adhesive:  You may briefly wet your wound in the shower or bath. Do not soak or scrub the wound. Do not swim. Avoid periods of heavy perspiration until the skin adhesive has fallen off on its own. After showering or bathing, gently pat the wound dry with a clean towel.  Do not apply liquid medicine, cream medicine, or ointment medicine to your wound while the skin adhesive is in place. This may loosen the film before your wound is healed.  If a dressing is placed over the wound, be careful not to apply tape directly over the skin adhesive. This may cause the adhesive to be pulled off before the wound is healed.  Avoid prolonged exposure to sunlight or tanning lamps while the skin adhesive is in place. Exposure to ultraviolet light in the first year will darken the scar.  The skin adhesive will usually remain in place for 5 to 10 days, then naturally fall off the skin. Do not pick at the adhesive film. You may need a tetanus shot  if:  You cannot remember when you had your last tetanus shot.  You have never had a tetanus shot. If you get a tetanus shot, your arm may swell, get red, and feel warm to the touch. This is common and not a problem. If you need a tetanus shot and you choose not to have one, there is a rare chance of getting tetanus. Sickness from tetanus can be serious. SEEK MEDICAL CARE IF:   You have redness, swelling, or increasing pain in the wound.  You see a red line that goes away from the wound.  You have yellowish-white fluid (pus) coming from the wound.  You have a  fever.  You notice a bad smell coming from the wound or dressing.  Your wound breaks open before or after sutures have been removed.  You notice something coming out of the wound such as wood or glass.  Your wound is on your hand or foot and you cannot move a finger or toe. SEEK IMMEDIATE MEDICAL CARE IF:   Your pain is not controlled with prescribed medicine.  You have severe swelling around the wound causing pain and numbness or a change in color in your arm, hand, leg, or foot.  Your wound splits open and starts bleeding.  You have worsening numbness, weakness, or loss of function of any joint around or beyond the wound.  You develop painful lumps near the wound or on the skin anywhere on your body. MAKE SURE YOU:   Understand these instructions.  Will watch your condition.  Will get help right away if you are not doing well or get worse. Document Released: 10/29/2005 Document Revised: 01/21/2012 Document Reviewed: 04/24/2011 [facility] Patient Information 2014 [place], [place]. Scar Minimization You will have a scar anytime you have surgery and a cut is made in the skin or you have something removed from your skin (mole, skin cancer, cyst). Although scars are unavoidable following surgery, there are ways to minimize their appearance. It is important to follow all the instructions you receive from your caregiver about wound care. How your wound heals will influence the appearance of your scar. If you do not follow the wound care instructions as directed, complications such as infection may occur. Wound instructions include keeping the wound clean, moist, and not letting the wound form a scab. Some people form scars that are raised and lumpy (hypertrophic) or larger than the initial wound (keloidal). HOME CARE INSTRUCTIONS   Follow wound care instructions as directed.  Keep the wound clean by washing it with soap and water.  Keep the wound moist with provided antibiotic cream or  petroleum jelly until completely healed. Moisten twice a day for about 2 weeks.  Get stitches (sutures) taken out at the scheduled time.  Avoid touching or manipulating your wound unless needed. Wash your hands thoroughly before and after touching your wound.  Follow all restrictions such as limits on exercise or work. This depends on where your scar is located.  Keep the scar protected from sunburn. Cover the scar with sunscreen/sunblock with SPF 30 or higher.  Gently massage the scar using a circular motion to help minimize the appearance of the scar. Do this only after the wound has closed and all the sutures have been removed.  For hypertrophic or keloidal scars, there are several ways to treat and minimize their appearance. Methods include compression therapy, intralesional corticosteroids, laser therapy, or surgery. These methods are performed by your caregiver. Remember that the scar may appear lighter  or darker than your normal skin color. This difference in color should even out with time. SEEK MEDICAL CARE IF:   You have a fever.  You develop signs of infection such as pain, redness, pus, and warmth.  You have questions or concerns. Document Released: 04/18/2010 Document Revised: 01/21/2012 Document Reviewed: 04/18/2010 [facility] Patient Information 2014 [place]ExitCare, [place].
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
## 2014-03-11 NOTE — ED Provider Notes (Signed)
I supervised procedure by Dr. Clearance CootsHarper and was present for the critical portions.  Raeford RazorStephen Javeria Briski, MD 03/11/14 1409

## 2014-05-27 ENCOUNTER — Encounter (HOSPITAL_COMMUNITY): Payer: Self-pay | Admitting: Emergency Medicine

## 2014-05-27 ENCOUNTER — Emergency Department (INDEPENDENT_AMBULATORY_CARE_PROVIDER_SITE_OTHER)
Admission: EM | Admit: 2014-05-27 | Discharge: 2014-05-27 | Disposition: A | Discharge status: Home / Self Care | Payer: Self-pay | Source: Home / Self Care

## 2014-05-27 DIAGNOSIS — K089 Disorder of teeth and supporting structures, unspecified: Secondary | ICD-10-CM

## 2014-05-27 DIAGNOSIS — K0889 Other specified disorders of teeth and supporting structures: Secondary | ICD-10-CM

## 2014-05-27 MED ORDER — AMOXICILLIN 500 MG PO CAPS: 1000.0000 mg | ORAL_CAPSULE | Freq: Two times a day (BID) | ORAL | Status: DC

## 2014-05-27 MED ORDER — HYDROCODONE-ACETAMINOPHEN 5-325 MG PO TABS: 1.0000 | ORAL_TABLET | ORAL | Status: DC | PRN

## 2014-05-27 NOTE — ED Provider Notes (Addendum)
CSN: [id number]     Arrival date & time 05/27/14  1735 History   First MD Initiated Contact with Patient 05/27/14 1757     Chief Complaint  Patient presents with  . Dental Pain   (Consider location/radiation/quality/duration/timing/severity/associated sxs/prior Treatment) HPI Comments: Recurrent toothache, most recent for 7-10 d.Noticed local buccal swelling and pus drainage.   Past Medical History  Diagnosis Date  . Asthma    History reviewed. No pertinent past surgical history. History reviewed. No pertinent family history. History  Substance Use Topics  . Smoking status: Current Every Day Smoker -- 0.50 packs/day    Types: Cigarettes  . Smokeless tobacco: Not on file  . Alcohol Use: Yes     Comment: occasional   OB History   Grav Para Term Preterm Abortions TAB SAB Ect Mult Living                 Review of Systems  Constitutional: Negative.   HENT: Positive for dental problem.   All other systems reviewed and are negative.   Allergies  Review of patient's allergies indicates no known allergies.  Home Medications   Prior to Admission medications   Medication Sig Start Date End Date Taking? Authorizing Provider  amoxicillin (AMOXIL) 500 MG capsule Take 2 capsules (1,000 mg total) by mouth 2 (two) times daily. 05/27/14   Hayden Rasmussenavid Mabe, NP  HYDROcodone-acetaminophen (NORCO/VICODIN) 5-325 MG per tablet Take 1 tablet by mouth every 4 (four) hours as needed. 05/27/14   Hayden Rasmussenavid Mabe, NP  traMADol (ULTRAM) 50 MG tablet Take 1 tablet (50 mg total) by mouth every 6 (six) hours as needed. 03/07/14   Rodolph BongEvan S Corey, MD   LMP 05/14/2014 Physical Exam  Nursing note and vitals reviewed. Constitutional: She is oriented to person, place, and time. She appears well-developed and well-nourished. No distress.  HENT:  Mouth/Throat: Oropharynx is clear and moist. No oropharyngeal exudate.  Cavernous L upper 2nd molar, tender. Adjacent mild buccal swelling. No drainage or erythema.  Eyes:  Conjunctivae and EOM are normal.  Pulmonary/Chest: Effort normal. No respiratory distress.  Neurological: She is alert and oriented to person, place, and time.  Skin: Skin is warm and dry.  Psychiatric: She has a normal mood and affect.    ED Course  Procedures (including critical care time) Labs Review Labs Reviewed - No data to display  Imaging Review No results found.   MDM   1. Toothache     norco 5 mg #15 Amoxil Keep dental appt    Hayden Rasmussenavid Mabe, NP 05/27/14 1812  Medical screening examination/treatment/procedure(s) were performed by a resident physician or non-physician practitioner and as the supervising physician I was immediately available for consultation/collaboration.  Shelly Flattenavid Shanette Tamargo, MD Family Medicine   Ozella Rocksavid J Jajaira Ruis, MD 05/29/14 [phone]

## 2014-05-27 NOTE — Discharge Instructions (Signed)
Dental Pain °Toothache is pain in or around a tooth. It may get worse with chewing or with cold or heat.  °HOME CARE °· Your dentist may use a numbing medicine during treatment. If so, you may need to avoid eating until the medicine wears off. Ask your dentist about this. °· Only take medicine as told by your dentist or doctor. °· Avoid chewing food near the painful tooth until after all treatment is done. Ask your dentist about this. °GET HELP RIGHT AWAY IF:  °· The problem gets worse or new problems appear. °· You have a fever. °· There is redness and puffiness (swelling) of the face, jaw, or neck. °· You cannot open your mouth. °· There is pain in the jaw. °· There is very bad pain that is not helped by medicine. °MAKE SURE YOU:  °· Understand these instructions. °· Will watch your condition. °· Will get help right away if you are not doing well or get worse. °Document Released: 04/16/2008 Document Revised: 01/21/2012 Document Reviewed: 04/16/2008 °Exit[employer]® Patient Information ©2015 [employer]. This information is not intended to replace advice given to you by your health care provider. Make sure you discuss any questions you have with your health care provider. ° °Dental Care and Dentist Visits °Dental care supports good overall health. Regular dental visits can also help you avoid dental pain, bleeding, infection, and other more serious health problems in the future. It is important to keep the mouth healthy because diseases in the teeth, gums, and other oral tissues can spread to other areas of the body. Some problems, such as diabetes, heart disease, and pre-term labor have been associated with poor oral health.  °See your dentist every 6 months. If you experience emergency problems such as a toothache or broken tooth, go to the dentist right away. If you see your dentist regularly, you may catch problems early. It is easier to be treated for problems in the early stages.  °WHAT TO EXPECT AT A DENTIST  VISIT  °Your dentist will look for many common oral health problems and recommend proper treatment. At your regular dental visit, you can expect: °· Gentle cleaning of the teeth and gums. This includes scraping and polishing. This helps to remove the sticky substance around the teeth and gums (plaque). Plaque forms in the mouth shortly after eating. Over time, plaque hardens on the teeth as tartar. If tartar is not removed regularly, it can cause problems. Cleaning also helps remove stains. °· Periodic X-rays. These pictures of the teeth and supporting bone will help your dentist assess the health of your teeth. °· Periodic fluoride treatments. Fluoride is a natural mineral shown to help strengthen teeth. Fluoride treatment involves applying a fluoride gel or varnish to the teeth. It is most commonly done in children. °· Examination of the mouth, tongue, jaws, teeth, and gums to look for any oral health problems, such as: °¨ Cavities (dental caries). This is decay on the tooth caused by plaque, sugar, and acid in the mouth. It is best to catch a cavity when it is small. °¨ Inflammation of the gums caused by plaque buildup (gingivitis). °¨ Problems with the mouth or malformed or misaligned teeth. °¨ Oral cancer or other diseases of the soft tissues or jaws.  °KEEP YOUR TEETH AND GUMS HEALTHY °For healthy teeth and gums, follow these general guidelines as well as your dentist's specific advice: °· Have your teeth professionally cleaned at the dentist every 6 months. °· Brush twice daily with a   fluoride toothpaste. °· Floss your teeth daily.  °· Ask your dentist if you need fluoride supplements, treatments, or fluoride toothpaste. °· Eat a healthy diet. Reduce foods and drinks with added sugar. °· Avoid smoking. °TREATMENT FOR ORAL HEALTH PROBLEMS °If you have oral health problems, treatment varies depending on the conditions present in your teeth and gums. °· Your caregiver will most likely recommend good oral hygiene  at each visit. °· For cavities, gingivitis, or other oral health disease, your caregiver will perform a procedure to treat the problem. This is typically done at a separate appointment. Sometimes your caregiver will refer you to another dental specialist for specific tooth problems or for surgery. °SEEK IMMEDIATE DENTAL CARE IF: °· You have pain, bleeding, or soreness in the gum, tooth, jaw, or mouth area. °· A permanent tooth becomes loose or separated from the gum socket. °· You experience a blow or injury to the mouth or jaw area. °Document Released: 07/11/2011 Document Revised: 01/21/2012 Document Reviewed: 07/11/2011 °ExitCare® Patient Information ©2015 [employer]. This information is not intended to replace advice given to you by your health care provider. Make sure you discuss any questions you have with your health care provider. ° °

## 2014-05-27 NOTE — ED Notes (Addendum)
C/o dental pain States right top tooth is broke Aleve and tylenol was given as tx No dentist appt scheduled  States abscess was present

## 2014-06-19 ENCOUNTER — Emergency Department (HOSPITAL_COMMUNITY)
Admission: EM | Admit: 2014-06-19 | Discharge: 2014-06-19 | Disposition: A | Discharge status: Home / Self Care | Payer: Self-pay | Attending: Emergency Medicine | Admitting: Emergency Medicine

## 2014-06-19 ENCOUNTER — Emergency Department (HOSPITAL_COMMUNITY): Payer: No Typology Code available for payment source

## 2014-06-19 ENCOUNTER — Encounter (HOSPITAL_COMMUNITY): Payer: Self-pay | Admitting: Emergency Medicine

## 2014-06-19 DIAGNOSIS — Y9389 Activity, other specified: Secondary | ICD-10-CM | POA: Insufficient documentation

## 2014-06-19 DIAGNOSIS — M546 Pain in thoracic spine: Secondary | ICD-10-CM

## 2014-06-19 DIAGNOSIS — Y92410 Unspecified street and highway as the place of occurrence of the external cause: Secondary | ICD-10-CM | POA: Insufficient documentation

## 2014-06-19 DIAGNOSIS — Z87828 Personal history of other (healed) physical injury and trauma: Secondary | ICD-10-CM | POA: Insufficient documentation

## 2014-06-19 DIAGNOSIS — IMO0002 Reserved for concepts with insufficient information to code with codable children: Secondary | ICD-10-CM | POA: Insufficient documentation

## 2014-06-19 DIAGNOSIS — J45909 Unspecified asthma, uncomplicated: Secondary | ICD-10-CM | POA: Insufficient documentation

## 2014-06-19 DIAGNOSIS — F17200 Nicotine dependence, unspecified, uncomplicated: Secondary | ICD-10-CM | POA: Insufficient documentation

## 2014-06-19 MED ORDER — IBUPROFEN 600 MG PO TABS: 600.0000 mg | ORAL_TABLET | Freq: Four times a day (QID) | ORAL | Status: DC | PRN

## 2014-06-19 MED ORDER — OXYCODONE-ACETAMINOPHEN 5-325 MG PO TABS: 1.0000 | ORAL_TABLET | ORAL | Status: DC | PRN

## 2014-06-19 MED ORDER — CYCLOBENZAPRINE HCL 10 MG PO TABS
10.0000 mg | ORAL_TABLET | Freq: Three times a day (TID) | ORAL | Status: DC | PRN
Start: 2014-06-19 — End: 2015-10-25

## 2014-06-19 MED ORDER — ORPHENADRINE CITRATE ER 100 MG PO TB12: 100.0000 mg | ORAL_TABLET | Freq: Two times a day (BID) | ORAL | Status: DC

## 2014-06-19 MED ORDER — OXYCODONE-ACETAMINOPHEN 5-325 MG PO TABS
2.0000 | ORAL_TABLET | Freq: Once | ORAL | Status: AC
  Administered 2014-06-19: 2 via ORAL
  Filled 2014-06-19: qty 2

## 2014-06-19 MED ORDER — DIAZEPAM 2 MG PO TABS
2.0000 mg | ORAL_TABLET | Freq: Once | ORAL | Status: AC
  Administered 2014-06-19: 2 mg via ORAL
  Filled 2014-06-19: qty 1

## 2014-06-19 MED ORDER — OXYCODONE-ACETAMINOPHEN 5-325 MG PO TABS: 2.0000 | ORAL_TABLET | ORAL | Status: DC | PRN

## 2014-06-19 MED ORDER — KETOROLAC TROMETHAMINE 30 MG/ML IJ SOLN
60.0000 mg | Freq: Once | INTRAMUSCULAR | Status: AC
  Administered 2014-06-19: 60 mg via INTRAMUSCULAR
  Filled 2014-06-19: qty 2

## 2014-06-19 MED ORDER — ORPHENADRINE CITRATE 30 MG/ML IJ SOLN: 60.0000 mg | Freq: Two times a day (BID) | INTRAMUSCULAR | Status: DC
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
## 2015-10-25 ENCOUNTER — Encounter (HOSPITAL_COMMUNITY): Payer: Self-pay | Admitting: Emergency Medicine

## 2015-10-25 ENCOUNTER — Emergency Department (HOSPITAL_COMMUNITY)
Admission: EM | Admit: 2015-10-25 | Discharge: 2015-10-25 | Disposition: A | Discharge status: Home / Self Care | Payer: No Typology Code available for payment source | Attending: Emergency Medicine | Admitting: Emergency Medicine

## 2015-10-25 ENCOUNTER — Emergency Department (HOSPITAL_COMMUNITY): Payer: No Typology Code available for payment source

## 2015-10-25 DIAGNOSIS — M545 Low back pain, unspecified: Secondary | ICD-10-CM

## 2015-10-25 DIAGNOSIS — T148XXA Other injury of unspecified body region, initial encounter: Secondary | ICD-10-CM

## 2015-10-25 DIAGNOSIS — Y998 Other external cause status: Secondary | ICD-10-CM | POA: Diagnosis not present

## 2015-10-25 DIAGNOSIS — Z3202 Encounter for pregnancy test, result negative: Secondary | ICD-10-CM | POA: Diagnosis not present

## 2015-10-25 DIAGNOSIS — S299XXA Unspecified injury of thorax, initial encounter: Secondary | ICD-10-CM | POA: Insufficient documentation

## 2015-10-25 DIAGNOSIS — T148 Other injury of unspecified body region: Secondary | ICD-10-CM | POA: Insufficient documentation

## 2015-10-25 DIAGNOSIS — S199XXA Unspecified injury of neck, initial encounter: Secondary | ICD-10-CM | POA: Diagnosis not present

## 2015-10-25 DIAGNOSIS — J45909 Unspecified asthma, uncomplicated: Secondary | ICD-10-CM | POA: Insufficient documentation

## 2015-10-25 DIAGNOSIS — Y9389 Activity, other specified: Secondary | ICD-10-CM | POA: Diagnosis not present

## 2015-10-25 DIAGNOSIS — F17210 Nicotine dependence, cigarettes, uncomplicated: Secondary | ICD-10-CM | POA: Diagnosis not present

## 2015-10-25 DIAGNOSIS — Y92410 Unspecified street and highway as the place of occurrence of the external cause: Secondary | ICD-10-CM | POA: Diagnosis not present

## 2015-10-25 DIAGNOSIS — S3992XA Unspecified injury of lower back, initial encounter: Secondary | ICD-10-CM | POA: Diagnosis present

## 2015-10-25 LAB — POC URINE PREG, ED: Preg Test, Ur: NEGATIVE

## 2015-10-25 MED ORDER — CYCLOBENZAPRINE HCL 10 MG PO TABS: 10.0000 mg | ORAL_TABLET | Freq: Two times a day (BID) | ORAL | Status: DC | PRN

## 2015-10-25 MED ORDER — TRAMADOL HCL 50 MG PO TABS: 50.0000 mg | ORAL_TABLET | Freq: Four times a day (QID) | ORAL | Status: DC | PRN

## 2015-10-25 MED ORDER — IBUPROFEN 800 MG PO TABS
800.0000 mg | ORAL_TABLET | Freq: Once | ORAL | Status: AC
  Administered 2015-10-25: 800 mg via ORAL
  Filled 2015-10-25: qty 1
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
## 2015-12-09 ENCOUNTER — Other Ambulatory Visit (HOSPITAL_COMMUNITY)
Admission: RE | Admit: 2015-12-09 | Discharge: 2015-12-09 | Disposition: A | Discharge status: Home / Self Care | Payer: Self-pay | Source: Ambulatory Visit | Attending: Family Medicine | Admitting: Family Medicine

## 2015-12-09 ENCOUNTER — Emergency Department (INDEPENDENT_AMBULATORY_CARE_PROVIDER_SITE_OTHER)
Admission: EM | Admit: 2015-12-09 | Discharge: 2015-12-09 | Disposition: A | Discharge status: Home / Self Care | Payer: Self-pay | Source: Home / Self Care | Attending: Family Medicine | Admitting: Family Medicine

## 2015-12-09 ENCOUNTER — Encounter (HOSPITAL_COMMUNITY): Payer: Self-pay | Admitting: *Deleted

## 2015-12-09 DIAGNOSIS — N390 Urinary tract infection, site not specified: Secondary | ICD-10-CM

## 2015-12-09 DIAGNOSIS — Z0000 Encounter for general adult medical examination without abnormal findings: Secondary | ICD-10-CM | POA: Insufficient documentation

## 2015-12-09 LAB — POCT URINALYSIS DIP (DEVICE)
Bilirubin Urine: NEGATIVE
Glucose, UA: NEGATIVE mg/dL
Ketones, ur: NEGATIVE mg/dL
Nitrite: POSITIVE — AB
Protein, ur: 100 mg/dL — AB
Specific Gravity, Urine: 1.02 (ref 1.005–1.030)
Urobilinogen, UA: 2 mg/dL — ABNORMAL HIGH (ref 0.0–1.0)
pH: 6 (ref 5.0–8.0)

## 2015-12-09 LAB — POCT PREGNANCY, URINE: Preg Test, Ur: NEGATIVE

## 2015-12-09 MED ORDER — CEPHALEXIN 500 MG PO CAPS: 500.0000 mg | ORAL_CAPSULE | Freq: Four times a day (QID) | ORAL | Status: DC

## 2015-12-09 MED ORDER — CEFTRIAXONE SODIUM 1 G IJ SOLR
INTRAMUSCULAR | Status: AC
  Filled 2015-12-09: qty 10

## 2015-12-09 MED ORDER — CEFTRIAXONE SODIUM 1 G IJ SOLR
1.0000 g | Freq: Once | INTRAMUSCULAR | Status: AC
Start: 2015-12-09 — End: 2015-12-09
  Administered 2015-12-09: 1 g via INTRAMUSCULAR
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
## 2015-12-09 NOTE — ED Notes (Signed)
Pt  Reports  Symptoms  Of  Body  Aches     Stuffy  Nose          With  Chills       And  Hoarseness   She  Reports           She  Reports         Constipated     Decreased         Fluid  Intake      Over  The last  Few  Days      also  Reports  irreg  Period  This  Month

## 2015-12-09 NOTE — Discharge Instructions (Signed)
Take all of medicine as directed start on saturday, drink lots of fluids, see your doctor if further problems.

## 2015-12-11 LAB — URINE CULTURE
Culture: >=100000
Special Requests: NORMAL

## 2015-12-31 ENCOUNTER — Emergency Department (HOSPITAL_COMMUNITY)
Admission: EM | Admit: 2015-12-31 | Discharge: 2015-12-31 | Disposition: A | Discharge status: Home / Self Care | Payer: Self-pay | Attending: Emergency Medicine | Admitting: Emergency Medicine

## 2015-12-31 ENCOUNTER — Encounter (HOSPITAL_COMMUNITY): Payer: Self-pay | Admitting: Nurse Practitioner

## 2015-12-31 DIAGNOSIS — Y9289 Other specified places as the place of occurrence of the external cause: Secondary | ICD-10-CM | POA: Insufficient documentation

## 2015-12-31 DIAGNOSIS — S50812A Abrasion of left forearm, initial encounter: Secondary | ICD-10-CM | POA: Insufficient documentation

## 2015-12-31 DIAGNOSIS — Y998 Other external cause status: Secondary | ICD-10-CM | POA: Insufficient documentation

## 2015-12-31 DIAGNOSIS — S61212A Laceration without foreign body of right middle finger without damage to nail, initial encounter: Secondary | ICD-10-CM | POA: Insufficient documentation

## 2015-12-31 DIAGNOSIS — Y9389 Activity, other specified: Secondary | ICD-10-CM | POA: Insufficient documentation

## 2015-12-31 DIAGNOSIS — W25XXXA Contact with sharp glass, initial encounter: Secondary | ICD-10-CM | POA: Insufficient documentation

## 2015-12-31 DIAGNOSIS — J45909 Unspecified asthma, uncomplicated: Secondary | ICD-10-CM | POA: Insufficient documentation

## 2015-12-31 DIAGNOSIS — F17210 Nicotine dependence, cigarettes, uncomplicated: Secondary | ICD-10-CM | POA: Insufficient documentation

## 2015-12-31 DIAGNOSIS — IMO0002 Reserved for concepts with insufficient information to code with codable children: Secondary | ICD-10-CM

## 2015-12-31 MED ORDER — BACITRACIN ZINC 500 UNIT/GM EX OINT: 1.0000 "application " | TOPICAL_OINTMENT | Freq: Two times a day (BID) | CUTANEOUS | Status: DC

## 2015-12-31 MED ORDER — CEPHALEXIN 500 MG PO CAPS: 500.0000 mg | ORAL_CAPSULE | Freq: Four times a day (QID) | ORAL | Status: DC

## 2015-12-31 MED ORDER — LORAZEPAM 0.5 MG PO TABS
1.0000 mg | ORAL_TABLET | Freq: Once | ORAL | Status: AC
  Administered 2015-12-31: 1 mg via ORAL
  Filled 2015-12-31: qty 2

## 2015-12-31 MED ORDER — ONDANSETRON HCL 4 MG PO TABS: 4.0000 mg | ORAL_TABLET | Freq: Four times a day (QID) | ORAL | Status: DC

## 2015-12-31 MED ORDER — HYDROCODONE-ACETAMINOPHEN 5-325 MG PO TABS: 2.0000 | ORAL_TABLET | ORAL | Status: DC | PRN
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
## 2015-12-31 NOTE — ED Notes (Signed)
Declined W/C at D/C and was escorted to lobby by RN. 
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
## 2015-12-31 NOTE — Discharge Instructions (Signed)
Please try to keep her wound clean and dry. Follow-up with your doctor or an urgent care facility in 3 days for a wound recheck. Return to ED in 10-12 days for suture removal. Take your antibiotics as prescribed, do not save or share them. He may take your pain medicine for moderate to severe pain, Motrin or Tylenol for mild to moderate pain. Use your Zofran as needed for nausea. Return to ED sooner for any worsening or concerning symptoms as we discussed.

## 2015-12-31 NOTE — ED Notes (Signed)
Lt arm soaked with warm water to remove dried on blood. Once the dried blood was removed multiple shards of glass observed on lt fore arm.

## 2016-01-09 ENCOUNTER — Emergency Department (INDEPENDENT_AMBULATORY_CARE_PROVIDER_SITE_OTHER)
Admission: EM | Admit: 2016-01-09 | Discharge: 2016-01-09 | Disposition: A | Discharge status: Home / Self Care | Payer: Self-pay | Source: Home / Self Care | Attending: Family Medicine | Admitting: Family Medicine

## 2016-01-09 ENCOUNTER — Encounter (HOSPITAL_COMMUNITY): Payer: Self-pay | Admitting: *Deleted

## 2016-01-09 DIAGNOSIS — Z5189 Encounter for other specified aftercare: Secondary | ICD-10-CM

## 2016-01-09 DIAGNOSIS — IMO0002 Reserved for concepts with insufficient information to code with codable children: Secondary | ICD-10-CM

## 2016-01-09 MED ORDER — IBUPROFEN 800 MG PO TABS
800.0000 mg | ORAL_TABLET | Freq: Once | ORAL | Status: AC
  Administered 2016-01-09: 800 mg via ORAL

## 2016-01-09 MED ORDER — IBUPROFEN 800 MG PO TABS
ORAL_TABLET | ORAL | Status: AC
  Filled 2016-01-09: qty 1

## 2016-01-09 NOTE — ED Notes (Signed)
Pt     Is  Here  For  A  Wound  Check  Of  Her  r  Arm   Sutures  Placed 9  Days  Ago  Pt  Reports    Pain  And  Swelling  At  The  Suture  Site       - sutures  Are  Intact     Slight  Swelling only noted
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
## 2016-01-09 NOTE — Discharge Instructions (Signed)
Warm towel for swelling 2-3 times a day, wear splint for soreness, return in 3-4 days for suture removal.

## 2016-02-18 ENCOUNTER — Ambulatory Visit (HOSPITAL_COMMUNITY)
Admission: EM | Admit: 2016-02-18 | Discharge: 2016-02-18 | Disposition: A | Discharge status: Home / Self Care | Payer: Self-pay | Attending: Family Medicine | Admitting: Family Medicine

## 2016-02-18 ENCOUNTER — Encounter (HOSPITAL_COMMUNITY): Payer: Self-pay

## 2016-02-18 DIAGNOSIS — Z4802 Encounter for removal of sutures: Secondary | ICD-10-CM

## 2016-02-18 DIAGNOSIS — M79631 Pain in right forearm: Secondary | ICD-10-CM

## 2016-02-18 MED ORDER — DICLOFENAC SODIUM 1 % TD GEL: 2.0000 g | Freq: Four times a day (QID) | TRANSDERMAL | Status: DC | PRN
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
## 2016-02-18 NOTE — Discharge Instructions (Signed)
It is a pleasure to see you today.   We removed the sutures in your right forearm, that had been placed on February 19th.   I am giving you a topical gel to place on the painful area up to four times a day as needed.   Follow up with your primary doctor if you continue to experience pain.

## 2016-02-18 NOTE — ED Notes (Signed)
Patient presents for a suture removal right arm, has been in for a month and a half. No acute distress

## 2016-04-17 ENCOUNTER — Encounter (HOSPITAL_COMMUNITY): Payer: Self-pay

## 2016-04-17 ENCOUNTER — Inpatient Hospital Stay (HOSPITAL_COMMUNITY)
Admission: AD | Admit: 2016-04-17 | Discharge: 2016-04-17 | Disposition: A | Discharge status: Home / Self Care | Payer: Medicaid Other | Source: Ambulatory Visit | Attending: Obstetrics and Gynecology | Admitting: Obstetrics and Gynecology

## 2016-04-17 ENCOUNTER — Inpatient Hospital Stay (HOSPITAL_COMMUNITY): Payer: Medicaid Other

## 2016-04-17 DIAGNOSIS — Z3A01 Less than 8 weeks gestation of pregnancy: Secondary | ICD-10-CM | POA: Insufficient documentation

## 2016-04-17 DIAGNOSIS — O99511 Diseases of the respiratory system complicating pregnancy, first trimester: Secondary | ICD-10-CM | POA: Insufficient documentation

## 2016-04-17 DIAGNOSIS — O26891 Other specified pregnancy related conditions, first trimester: Secondary | ICD-10-CM | POA: Insufficient documentation

## 2016-04-17 DIAGNOSIS — J45909 Unspecified asthma, uncomplicated: Secondary | ICD-10-CM | POA: Insufficient documentation

## 2016-04-17 DIAGNOSIS — F17210 Nicotine dependence, cigarettes, uncomplicated: Secondary | ICD-10-CM | POA: Insufficient documentation

## 2016-04-17 DIAGNOSIS — O219 Vomiting of pregnancy, unspecified: Secondary | ICD-10-CM | POA: Insufficient documentation

## 2016-04-17 DIAGNOSIS — R102 Pelvic and perineal pain: Secondary | ICD-10-CM

## 2016-04-17 DIAGNOSIS — Z3490 Encounter for supervision of normal pregnancy, unspecified, unspecified trimester: Secondary | ICD-10-CM

## 2016-04-17 DIAGNOSIS — O99331 Smoking (tobacco) complicating pregnancy, first trimester: Secondary | ICD-10-CM | POA: Insufficient documentation

## 2016-04-17 LAB — CBC
HCT: 36.1 % (ref 36.0–46.0)
Hemoglobin: 12.2 g/dL (ref 12.0–15.0)
MCH: 29.4 pg (ref 26.0–34.0)
MCHC: 33.8 g/dL (ref 30.0–36.0)
MCV: 87 fL (ref 78.0–100.0)
Platelets: 281 10*3/uL (ref 150–400)
RBC: 4.15 MIL/uL (ref 3.87–5.11)
RDW: 12.6 % (ref 11.5–15.5)
WBC: 9.9 10*3/uL (ref 4.0–10.5)

## 2016-04-17 LAB — URINALYSIS, ROUTINE W REFLEX MICROSCOPIC
Bilirubin Urine: NEGATIVE
Glucose, UA: NEGATIVE mg/dL
Hgb urine dipstick: NEGATIVE
Ketones, ur: NEGATIVE mg/dL
Nitrite: NEGATIVE
Protein, ur: NEGATIVE mg/dL
Specific Gravity, Urine: 1.02 (ref 1.005–1.030)
pH: 6 (ref 5.0–8.0)

## 2016-04-17 LAB — WET PREP, GENITAL
Sperm: NONE SEEN
Trich, Wet Prep: NONE SEEN
Yeast Wet Prep HPF POC: NONE SEEN

## 2016-04-17 LAB — URINE MICROSCOPIC-ADD ON

## 2016-04-17 LAB — POCT PREGNANCY, URINE: Preg Test, Ur: POSITIVE — AB

## 2016-04-17 LAB — HCG, QUANTITATIVE, PREGNANCY: hCG, Beta Chain, Quant, S: 65072 m[IU]/mL — ABNORMAL HIGH (ref <5)

## 2016-04-17 MED ORDER — PROMETHAZINE HCL 25 MG PO TABS: 12.5000 mg | ORAL_TABLET | Freq: Four times a day (QID) | ORAL | Status: DC | PRN

## 2016-04-17 NOTE — Discharge Instructions (Signed)
Prenatal Care Providers °[facility]    [facility] ° Phone- [phone]     Phone: [phone] °         °[facility]                      [facility] ° @[place]     Phone: [phone] ° Phone: [phone] °        [facility] °[facility]     Phone: [phone] ° Phone: [phone]   °        [facility] °[facility] [place]                hone: [phone] ° Phone: [phone] °        [facility] °Dr. Bernard Marshall      Phone: [phone] ° Phone: [phone] °        [facility] °[facility]                Phone: [phone] ° Women’s Health  ° Phone:[phone]    [facility] °         Phone: [phone] °[facility] °  Phone: [phone] °Safe Medications in Pregnancy  ° °Acne: °Benzoyl Peroxide °Salicylic Acid ° °Backache/Headache: °Tylenol: 2 regular strength every 4 hours OR °             2 Extra strength every 6 hours ° °Colds/Coughs/Allergies: °Benadryl (alcohol free) 25 mg every 6 hours as needed °Breath right strips °Claritin °Cepacol throat lozenges °Chloraseptic throat spray °Cold-Eeze- up to three times per day °Cough drops, alcohol free °Flonase (by prescription only) °Guaifenesin °Mucinex °Robitussin DM (plain only, alcohol free) °Saline nasal spray/drops °Sudafed (pseudoephedrine) & Actifed ** use only after [redacted] weeks gestation and if you do not have high blood pressure °Tylenol °Vicks Vaporub °Zinc lozenges °Zyrtec  ° °Constipation: °Colace °Ducolax suppositories °Fleet enema °Glycerin suppositories °Metamucil °Milk of magnesia °Miralax °Senokot °Smooth move tea ° °Diarrhea: °Kaopectate °Imodium A-D ° °*NO pepto Bismol ° °Hemorrhoids: °Anusol °Anusol HC °Preparation H °Tucks ° °Indigestion: °Tums °Maalox °Mylanta °Zantac  °Pepcid ° °Insomnia: °Benadryl (alcohol free) 25mg every 6 hours as needed °Tylenol  PM °Unisom, no Gelcaps ° °Leg Cramps: °Tums °MagGel ° °Nausea/Vomiting:  °Bonine °Dramamine °Emetrol °Ginger extract °Sea bands °Meclizine  °Nausea medication to take during pregnancy:  °Unisom (doxylamine succinate 25 mg tablets) Take one tablet daily at bedtime. If symptoms are not adequately controlled, the dose can be increased to a maximum recommended dose of two tablets daily (1/2 tablet in the morning, 1/2 tablet mid-afternoon and one at bedtime). °Vitamin B6 100mg tablets. Take one tablet twice a day (up to 200 mg per day). ° °Skin Rashes: °Aveeno products °Benadryl cream or 25mg every 6 hours as needed °Calamine Lotion °1% cortisone cream ° °Yeast infection: °Gyne-lotrimin 7 °Monistat 7 ° ° °**If taking multiple medications, please check labels to avoid duplicating the same active ingredients °**take medication as directed on the label °** Do not exceed 4000 mg of tylenol in 24 hours °**Do not take medications that contain aspirin or ibuprofen ° ° ° ° °
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
## 2016-04-18 LAB — GC/CHLAMYDIA PROBE AMP (~~LOC~~) NOT AT ARMC
Chlamydia: NEGATIVE
Neisseria Gonorrhea: NEGATIVE

## 2016-04-18 LAB — RPR: RPR Ser Ql: NONREACTIVE

## 2016-04-18 LAB — HIV ANTIBODY (ROUTINE TESTING W REFLEX): HIV Screen 4th Generation wRfx: NONREACTIVE

## 2016-05-28 ENCOUNTER — Inpatient Hospital Stay (HOSPITAL_COMMUNITY)
Admission: AD | Admit: 2016-05-28 | Discharge: 2016-05-29 | Disposition: A | Discharge status: Home / Self Care | Payer: Medicaid Other | Source: Ambulatory Visit | Attending: Obstetrics and Gynecology | Admitting: Obstetrics and Gynecology

## 2016-05-28 ENCOUNTER — Encounter (HOSPITAL_COMMUNITY): Payer: Self-pay

## 2016-05-28 DIAGNOSIS — O99330 Smoking (tobacco) complicating pregnancy, unspecified trimester: Secondary | ICD-10-CM | POA: Diagnosis not present

## 2016-05-28 DIAGNOSIS — O219 Vomiting of pregnancy, unspecified: Secondary | ICD-10-CM | POA: Diagnosis not present

## 2016-05-28 DIAGNOSIS — O99519 Diseases of the respiratory system complicating pregnancy, unspecified trimester: Secondary | ICD-10-CM | POA: Insufficient documentation

## 2016-05-28 DIAGNOSIS — O26899 Other specified pregnancy related conditions, unspecified trimester: Secondary | ICD-10-CM | POA: Insufficient documentation

## 2016-05-28 DIAGNOSIS — J45909 Unspecified asthma, uncomplicated: Secondary | ICD-10-CM | POA: Insufficient documentation

## 2016-05-28 DIAGNOSIS — R102 Pelvic and perineal pain: Secondary | ICD-10-CM | POA: Insufficient documentation

## 2016-05-28 DIAGNOSIS — N949 Unspecified condition associated with female genital organs and menstrual cycle: Secondary | ICD-10-CM

## 2016-05-28 LAB — URINALYSIS, ROUTINE W REFLEX MICROSCOPIC
Bilirubin Urine: NEGATIVE
Glucose, UA: NEGATIVE mg/dL
Hgb urine dipstick: NEGATIVE
Ketones, ur: 15 mg/dL — AB
Nitrite: NEGATIVE
Protein, ur: NEGATIVE mg/dL
Specific Gravity, Urine: 1.02 (ref 1.005–1.030)
pH: 7.5 (ref 5.0–8.0)

## 2016-05-28 LAB — URINE MICROSCOPIC-ADD ON
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
## 2016-05-29 DIAGNOSIS — O219 Vomiting of pregnancy, unspecified: Secondary | ICD-10-CM | POA: Diagnosis not present

## 2016-05-29 MED ORDER — PROMETHAZINE HCL 25 MG PO TABS: 12.5000 mg | ORAL_TABLET | Freq: Four times a day (QID) | ORAL | Status: DC | PRN

## 2016-05-29 NOTE — Discharge Instructions (Signed)
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
## 2016-05-30 LAB — URINE CULTURE

## 2016-06-22 ENCOUNTER — Encounter: Payer: Self-pay | Admitting: *Deleted

## 2016-06-25 ENCOUNTER — Ambulatory Visit (INDEPENDENT_AMBULATORY_CARE_PROVIDER_SITE_OTHER): Payer: Medicaid Other | Admitting: Obstetrics & Gynecology

## 2016-06-25 ENCOUNTER — Encounter: Payer: Self-pay | Admitting: Obstetrics & Gynecology

## 2016-06-25 VITALS — BP 112/69 | HR 83 | Temp 97.7°F | Wt 198.7 lb

## 2016-06-25 DIAGNOSIS — Z1389 Encounter for screening for other disorder: Secondary | ICD-10-CM | POA: Diagnosis not present

## 2016-06-25 DIAGNOSIS — Z331 Pregnant state, incidental: Secondary | ICD-10-CM | POA: Diagnosis not present

## 2016-06-25 DIAGNOSIS — Z3492 Encounter for supervision of normal pregnancy, unspecified, second trimester: Secondary | ICD-10-CM

## 2016-06-25 DIAGNOSIS — O99320 Drug use complicating pregnancy, unspecified trimester: Secondary | ICD-10-CM | POA: Insufficient documentation

## 2016-06-25 DIAGNOSIS — R87619 Unspecified abnormal cytological findings in specimens from cervix uteri: Secondary | ICD-10-CM

## 2016-06-25 DIAGNOSIS — O99322 Drug use complicating pregnancy, second trimester: Secondary | ICD-10-CM | POA: Diagnosis not present

## 2016-06-25 DIAGNOSIS — O3440 Maternal care for other abnormalities of cervix, unspecified trimester: Secondary | ICD-10-CM

## 2016-06-25 DIAGNOSIS — Z3480 Encounter for supervision of other normal pregnancy, unspecified trimester: Secondary | ICD-10-CM

## 2016-06-25 DIAGNOSIS — Z3490 Encounter for supervision of normal pregnancy, unspecified, unspecified trimester: Secondary | ICD-10-CM | POA: Insufficient documentation

## 2016-06-25 LAB — POCT URINALYSIS DIPSTICK
Bilirubin, UA: NEGATIVE
Blood, UA: NEGATIVE
Glucose, UA: NEGATIVE
Ketones, UA: NEGATIVE
Nitrite, UA: NEGATIVE
Spec Grav, UA: 1.01
Urobilinogen, UA: 0.2
pH, UA: 8

## 2016-06-25 MED ORDER — PREPLUS 27-1 MG PO TABS: 1.0000 | ORAL_TABLET | Freq: Every day | ORAL | 13 refills | Status: DC
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
## 2016-06-25 NOTE — Progress Notes (Signed)
Pt denies concerns at this time. 

## 2016-06-25 NOTE — Patient Instructions (Signed)

## 2016-06-25 NOTE — Progress Notes (Signed)
Pt c/o abdominal cramping after vomiting.

## 2016-06-27 LAB — URINE CULTURE, OB REFLEX

## 2016-06-27 LAB — GC/CHLAMYDIA PROBE AMP
Chlamydia trachomatis, NAA: NEGATIVE
Neisseria gonorrhoeae by PCR: NEGATIVE

## 2016-06-27 LAB — CULTURE, OB URINE

## 2016-06-28 LAB — PAP IG W/ RFLX HPV ASCU: PAP Smear Comment: 0

## 2016-07-03 ENCOUNTER — Inpatient Hospital Stay (HOSPITAL_COMMUNITY)
Admission: AD | Admit: 2016-07-03 | Discharge: 2016-07-03 | Disposition: A | Discharge status: Home / Self Care | Payer: Medicaid Other | Source: Ambulatory Visit | Attending: Family Medicine | Admitting: Family Medicine

## 2016-07-03 ENCOUNTER — Encounter (HOSPITAL_COMMUNITY): Payer: Self-pay

## 2016-07-03 DIAGNOSIS — A5901 Trichomonal vulvovaginitis: Secondary | ICD-10-CM | POA: Diagnosis not present

## 2016-07-03 DIAGNOSIS — Z885 Allergy status to narcotic agent status: Secondary | ICD-10-CM | POA: Diagnosis not present

## 2016-07-03 DIAGNOSIS — R12 Heartburn: Secondary | ICD-10-CM | POA: Insufficient documentation

## 2016-07-03 DIAGNOSIS — O219 Vomiting of pregnancy, unspecified: Secondary | ICD-10-CM | POA: Diagnosis not present

## 2016-07-03 DIAGNOSIS — O23592 Infection of other part of genital tract in pregnancy, second trimester: Secondary | ICD-10-CM

## 2016-07-03 DIAGNOSIS — O98312 Other infections with a predominantly sexual mode of transmission complicating pregnancy, second trimester: Secondary | ICD-10-CM | POA: Insufficient documentation

## 2016-07-03 DIAGNOSIS — R112 Nausea with vomiting, unspecified: Secondary | ICD-10-CM | POA: Diagnosis present

## 2016-07-03 DIAGNOSIS — O26892 Other specified pregnancy related conditions, second trimester: Secondary | ICD-10-CM | POA: Diagnosis not present

## 2016-07-03 DIAGNOSIS — F1290 Cannabis use, unspecified, uncomplicated: Secondary | ICD-10-CM | POA: Diagnosis not present

## 2016-07-03 DIAGNOSIS — Z3A18 18 weeks gestation of pregnancy: Secondary | ICD-10-CM | POA: Insufficient documentation

## 2016-07-03 DIAGNOSIS — O99322 Drug use complicating pregnancy, second trimester: Secondary | ICD-10-CM | POA: Diagnosis not present

## 2016-07-03 DIAGNOSIS — Z3480 Encounter for supervision of other normal pregnancy, unspecified trimester: Secondary | ICD-10-CM

## 2016-07-03 DIAGNOSIS — Z87891 Personal history of nicotine dependence: Secondary | ICD-10-CM | POA: Diagnosis not present

## 2016-07-03 HISTORY — DX: Other seasonal allergic rhinitis: J30.2

## 2016-07-03 LAB — URINALYSIS, ROUTINE W REFLEX MICROSCOPIC
Bilirubin Urine: NEGATIVE
Glucose, UA: NEGATIVE mg/dL
Hgb urine dipstick: NEGATIVE
Ketones, ur: NEGATIVE mg/dL
Nitrite: NEGATIVE
Protein, ur: NEGATIVE mg/dL
Specific Gravity, Urine: 1.025 (ref 1.005–1.030)
pH: 6 (ref 5.0–8.0)

## 2016-07-03 LAB — CBC
HCT: 33.5 % — ABNORMAL LOW (ref 36.0–46.0)
Hemoglobin: 11.4 g/dL — ABNORMAL LOW (ref 12.0–15.0)
MCH: 29.6 pg (ref 26.0–34.0)
MCHC: 34 g/dL (ref 30.0–36.0)
MCV: 87 fL (ref 78.0–100.0)
Platelets: 226 10*3/uL (ref 150–400)
RBC: 3.85 MIL/uL — ABNORMAL LOW (ref 3.87–5.11)
RDW: 13.4 % (ref 11.5–15.5)
WBC: 10.2 10*3/uL (ref 4.0–10.5)

## 2016-07-03 LAB — COMPREHENSIVE METABOLIC PANEL
ALT: 11 U/L — ABNORMAL LOW (ref 14–54)
AST: 14 U/L — ABNORMAL LOW (ref 15–41)
Albumin: 3.4 g/dL — ABNORMAL LOW (ref 3.5–5.0)
Alkaline Phosphatase: 55 U/L (ref 38–126)
Anion gap: 5 (ref 5–15)
BUN: 6 mg/dL (ref 6–20)
CO2: 23 mmol/L (ref 22–32)
Calcium: 8.3 mg/dL — ABNORMAL LOW (ref 8.9–10.3)
Chloride: 106 mmol/L (ref 101–111)
Creatinine, Ser: 0.57 mg/dL (ref 0.44–1.00)
GFR calc Af Amer: >60 mL/min (ref >60)
GFR calc non Af Amer: >60 mL/min (ref >60)
Glucose, Bld: 79 mg/dL (ref 65–99)
Potassium: 3.7 mmol/L (ref 3.5–5.1)
Sodium: 134 mmol/L — ABNORMAL LOW (ref 135–145)
Total Bilirubin: 0.4 mg/dL (ref 0.3–1.2)
Total Protein: 6.6 g/dL (ref 6.5–8.1)

## 2016-07-03 LAB — URINE MICROSCOPIC-ADD ON
Bacteria, UA: NONE SEEN
RBC / HPF: NONE SEEN RBC/hpf (ref 0–5)

## 2016-07-03 MED ORDER — PANTOPRAZOLE SODIUM 40 MG PO TBEC: 40.0000 mg | DELAYED_RELEASE_TABLET | Freq: Every day | ORAL | 2 refills | Status: DC

## 2016-07-03 MED ORDER — METRONIDAZOLE 500 MG PO TABS
2000.0000 mg | ORAL_TABLET | Freq: Once | ORAL | Status: AC
  Administered 2016-07-03: 2000 mg via ORAL
  Filled 2016-07-03: qty 4

## 2016-07-03 MED ORDER — ONDANSETRON 8 MG PO TBDP
8.0000 mg | ORAL_TABLET | ORAL | Status: AC
  Administered 2016-07-03: 8 mg via ORAL
  Filled 2016-07-03: qty 1

## 2016-07-03 MED ORDER — ONDANSETRON 4 MG PO TBDP: 4.0000 mg | ORAL_TABLET | Freq: Four times a day (QID) | ORAL | 2 refills | Status: DC | PRN

## 2016-07-03 NOTE — MAU Note (Signed)
Woe up feeling real bad.  Nausea/ vomiting, one episode of loose stool. Has a headache, feels hot, makes her feel like she is going to pass out. Voice is raspy.  Does have cough.

## 2016-07-03 NOTE — Discharge Instructions (Signed)
Trichomoniasis °Trichomoniasis is an infection caused by an organism called Trichomonas. The infection can affect both women and men. In women, the outer female genitalia and the vagina are affected. In men, the penis is mainly affected, but the prostate and other reproductive organs can also be involved. Trichomoniasis is a sexually transmitted infection (STI) and is most often passed to another person through sexual contact.  °RISK FACTORS °· Having unprotected sexual intercourse. °· Having sexual intercourse with an infected partner. °SIGNS AND SYMPTOMS  °Symptoms of trichomoniasis in women include: °· Abnormal gray-green frothy vaginal discharge. °· Itching and irritation of the vagina. °· Itching and irritation of the area outside the vagina. °Symptoms of trichomoniasis in men include:  °· Penile discharge with or without pain. °· Pain during urination. This results from inflammation of the urethra. °DIAGNOSIS  °Trichomoniasis may be found during a Pap test or physical exam. Your health care provider may use one of the following methods to help diagnose this infection: °· Testing the pH of the vagina with a test tape. °· Using a vaginal swab test that checks for the Trichomonas organism. A test is available that provides results within a few minutes. °· Examining a urine sample. °· Testing vaginal secretions. °Your health care provider may test you for other STIs, including HIV. °TREATMENT  °· You may be given medicine to fight the infection. Women should inform their health care provider if they could be or are pregnant. Some medicines used to treat the infection should not be taken during pregnancy. °· Your health care provider may recommend over-the-counter medicines or creams to decrease itching or irritation. °· Your sexual partner will need to be treated if infected. °· Your health care provider may test you for infection again 3 months after treatment. °HOME CARE INSTRUCTIONS  °· Take medicines only as  directed by your health care provider. °· Take over-the-counter medicine for itching or irritation as directed by your health care provider. °· Do not have sexual intercourse while you have the infection. °· Women should not douche or wear tampons while they have the infection. °· Discuss your infection with your partner. Your partner may have gotten the infection from you, or you may have gotten it from your partner. °· Have your sex partner get examined and treated if necessary. °· Practice safe, informed, and protected sex. °· See your health care provider for other STI testing. °SEEK MEDICAL CARE IF:  °· You still have symptoms after you finish your medicine. °· You develop abdominal pain. °· You have pain when you urinate. °· You have bleeding after sexual intercourse. °· You develop a rash. °· Your medicine makes you sick or makes you throw up (vomit). °MAKE SURE YOU: °· Understand these instructions. °· Will watch your condition. °· Will get help right away if you are not doing well or get worse. °  °This information is not intended to replace advice given to you by your health care provider. Make sure you discuss any questions you have with your health care provider. °  °Document Released: 04/24/2001 Document Revised: 11/19/2014 Document Reviewed: 08/10/2013 °Elsevier Interactive Patient Education ©2016 [employer] °Gastroesophageal Reflux Disease, Adult °Normally, food travels down the esophagus and stays in the stomach to be digested. However, when a person has gastroesophageal reflux disease (GERD), food and stomach acid move back up into the esophagus. When this happens, the esophagus becomes sore and inflamed. Over time, GERD can create small holes (ulcers) in the lining of the esophagus.  °  CAUSES °This condition is caused by a problem with the muscle between the esophagus and the stomach (lower esophageal sphincter, or LES). Normally, the LES muscle closes after food passes through the esophagus to  the stomach. When the LES is weakened or abnormal, it does not close properly, and that allows food and stomach acid to go back up into the esophagus. The LES can be weakened by certain dietary substances, medicines, and medical conditions, including: °· Tobacco use. °· Pregnancy. °· Having a hiatal hernia. °· Heavy alcohol use. °· Certain foods and beverages, such as coffee, chocolate, onions, and peppermint. °RISK FACTORS °This condition is more likely to develop in: °· People who have an increased body weight. °· People who have connective tissue disorders. °· People who use NSAID medicines. °SYMPTOMS °Symptoms of this condition include: °· Heartburn. °· Difficult or painful swallowing. °· The feeling of having a lump in the throat. °· A bitter taste in the mouth. °· Bad breath. °· Having a large amount of saliva. °· Having an upset or bloated stomach. °· Belching. °· Chest pain. °· Shortness of breath or wheezing. °· Ongoing (chronic) cough or a night-time cough. °· Wearing away of tooth enamel. °· Weight loss. °Different conditions can cause chest pain. Make sure to see your health care provider if you experience chest pain. °DIAGNOSIS °Your health care provider will take a medical history and perform a physical exam. To determine if you have mild or severe GERD, your health care provider may also monitor how you respond to treatment. You may also have other tests, including: °· An endoscopy to examine your stomach and esophagus with a small camera. °· A test that measures the acidity level in your esophagus. °· A test that measures how much pressure is on your esophagus. °· A barium swallow or modified barium swallow to show the shape, size, and functioning of your esophagus. °TREATMENT °The goal of treatment is to help relieve your symptoms and to prevent complications. Treatment for this condition may vary depending on how severe your symptoms are. Your health care provider may recommend: °· Changes to your  diet. °· Medicine. °· Surgery. °HOME CARE INSTRUCTIONS °Diet °· Follow a diet as recommended by your health care provider. This may involve avoiding foods and drinks such as: °¨ Coffee and tea (with or without caffeine). °¨ Drinks that contain alcohol. °¨ Energy drinks and sports drinks. °¨ Carbonated drinks or sodas. °¨ Chocolate and cocoa. °¨ Peppermint and mint flavorings. °¨ Garlic and onions. °¨ Horseradish. °¨ Spicy and acidic foods, including peppers, chili powder, curry powder, vinegar, hot sauces, and barbecue sauce. °¨ Citrus fruit juices and citrus fruits, such as oranges, lemons, and limes. °¨ Tomato-based foods, such as red sauce, chili, salsa, and pizza with red sauce. °¨ Fried and fatty foods, such as donuts, french fries, potato chips, and high-fat dressings. °¨ High-fat meats, such as hot dogs and fatty cuts of red and white meats, such as rib eye steak, sausage, ham, and bacon. °¨ High-fat dairy items, such as whole milk, butter, and cream cheese. °· Eat small, frequent meals instead of large meals. °· Avoid drinking large amounts of liquid with your meals. °· Avoid eating meals during the 2-3 hours before bedtime. °· Avoid lying down right after you eat. °· Do not exercise right after you eat. ° General Instructions  °· Pay attention to any changes in your symptoms. °· Take over-the-counter and prescription medicines only as told by your health care provider. Do not take aspirin, ibuprofen, or other NSAIDs   unless your health care provider told you to do so. °· Do not use any tobacco products, including cigarettes, chewing tobacco, and e-cigarettes. If you need help quitting, ask your health care provider. °· Wear loose-fitting clothing. Do not wear anything tight around your waist that causes pressure on your abdomen. °· Raise (elevate) the head of your bed 6 inches (15cm). °· Try to reduce your stress, such as with yoga or meditation. If you need help reducing stress, ask your health care  provider. °· If you are overweight, reduce your weight to an amount that is healthy for you. Ask your health care provider for guidance about a safe weight loss goal. °· Keep all follow-up visits as told by your health care provider. This is important. °SEEK MEDICAL CARE IF: °· You have new symptoms. °· You have unexplained weight loss. °· You have difficulty swallowing, or it hurts to swallow. °· You have wheezing or a persistent cough. °· Your symptoms do not improve with treatment. °· You have a hoarse voice. °SEEK IMMEDIATE MEDICAL CARE IF: °· You have pain in your arms, neck, jaw, teeth, or back. °· You feel sweaty, dizzy, or light-headed. °· You have chest pain or shortness of breath. °· You vomit and your vomit looks like blood or coffee grounds. °· You faint. °· Your stool is bloody or black. °· You cannot swallow, drink, or eat. °  °This information is not intended to replace advice given to you by your health care provider. Make sure you discuss any questions you have with your health care provider. °  °Document Released: 08/08/2005 Document Revised: 07/20/2015 Document Reviewed: 02/23/2015 °Elsevier Interactive Patient Education ©2016 Elsevier Inc. ° °
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
## 2016-07-09 DIAGNOSIS — O3440 Maternal care for other abnormalities of cervix, unspecified trimester: Secondary | ICD-10-CM | POA: Insufficient documentation

## 2016-07-09 DIAGNOSIS — R87619 Unspecified abnormal cytological findings in specimens from cervix uteri: Secondary | ICD-10-CM

## 2016-07-09 LAB — AFP, QUAD SCREEN
DIA Mom Value: 1.77
DIA Value (EIA): 261.51 pg/mL
DSR (By Age)    1 IN: 1074
DSR (Second Trimester) 1 IN: 1043
Gestational Age: 17 WEEKS
MSAFP Mom: 0.98
MSAFP: 34.2 ng/mL
MSHCG Mom: 1.3
MSHCG: 35520 m[IU]/mL
Maternal Age At EDD: 23.9 YEARS
Osb Risk: 10000
PDF: 0
T18 (By Age): 1:4185 {titer}
Test Results:: NEGATIVE
Weight: 198 [lb_av]
uE3 Mom: 0.87
uE3 Value: 0.86 ng/mL

## 2016-07-09 LAB — PRENATAL PROFILE I(LABCORP)
Antibody Screen: NEGATIVE
Basophils Absolute: 0 10*3/uL (ref 0.0–0.2)
Basos: 0 %
EOS (ABSOLUTE): 0.1 10*3/uL (ref 0.0–0.4)
Eos: 1 %
Hematocrit: 36.8 % (ref 34.0–46.6)
Hemoglobin: 11.9 g/dL (ref 11.1–15.9)
Hepatitis B Surface Ag: NEGATIVE
Immature Grans (Abs): 0 10*3/uL (ref 0.0–0.1)
Immature Granulocytes: 0 %
Lymphocytes Absolute: 2.6 10*3/uL (ref 0.7–3.1)
Lymphs: 30 %
MCH: 29.7 pg (ref 26.6–33.0)
MCHC: 32.3 g/dL (ref 31.5–35.7)
MCV: 92 fL (ref 79–97)
Monocytes Absolute: 0.5 10*3/uL (ref 0.1–0.9)
Monocytes: 6 %
Neutrophils Absolute: 5.5 10*3/uL (ref 1.4–7.0)
Neutrophils: 63 %
Platelets: 269 10*3/uL (ref 150–379)
RBC: 4.01 x10E6/uL (ref 3.77–5.28)
RDW: 14.2 % (ref 12.3–15.4)
RPR Ser Ql: NONREACTIVE
Rh Factor: POSITIVE
Rubella Antibodies, IGG: 2.12 index (ref >0.99)
WBC: 8.8 10*3/uL (ref 3.4–10.8)

## 2016-07-09 LAB — TOXASSURE SELECT 13 (MW), URINE: PDF: 0

## 2016-07-09 LAB — HEMOGLOBINOPATHY EVALUATION
HGB C: 0 %
HGB S: 0 %
Hemoglobin A2 Quantitation: 2.3 % (ref 0.7–3.1)
Hemoglobin F Quantitation: 0 % (ref 0.0–2.0)
Hgb A: 97.7 % (ref 94.0–98.0)

## 2016-07-09 LAB — HIV ANTIBODY (ROUTINE TESTING W REFLEX): HIV Screen 4th Generation wRfx: NONREACTIVE

## 2016-07-26 ENCOUNTER — Ambulatory Visit (INDEPENDENT_AMBULATORY_CARE_PROVIDER_SITE_OTHER): Payer: Medicaid Other | Admitting: Obstetrics and Gynecology

## 2016-07-26 ENCOUNTER — Ambulatory Visit (INDEPENDENT_AMBULATORY_CARE_PROVIDER_SITE_OTHER): Payer: Medicaid Other

## 2016-07-26 VITALS — BP 134/83 | HR 77 | Temp 98.1°F | Wt 215.4 lb

## 2016-07-26 DIAGNOSIS — R87619 Unspecified abnormal cytological findings in specimens from cervix uteri: Secondary | ICD-10-CM

## 2016-07-26 DIAGNOSIS — A599 Trichomoniasis, unspecified: Secondary | ICD-10-CM

## 2016-07-26 DIAGNOSIS — Z36 Encounter for antenatal screening of mother: Secondary | ICD-10-CM

## 2016-07-26 DIAGNOSIS — Z3482 Encounter for supervision of other normal pregnancy, second trimester: Secondary | ICD-10-CM | POA: Diagnosis not present

## 2016-07-26 DIAGNOSIS — O3440 Maternal care for other abnormalities of cervix, unspecified trimester: Secondary | ICD-10-CM | POA: Diagnosis not present

## 2016-07-26 DIAGNOSIS — Z3492 Encounter for supervision of normal pregnancy, unspecified, second trimester: Secondary | ICD-10-CM

## 2016-07-26 DIAGNOSIS — IMO0002 Reserved for concepts with insufficient information to code with codable children: Secondary | ICD-10-CM

## 2016-07-26 DIAGNOSIS — Z3480 Encounter for supervision of other normal pregnancy, unspecified trimester: Secondary | ICD-10-CM

## 2016-07-26 MED ORDER — PREPLUS 27-1 MG PO TABS: 1.0000 | ORAL_TABLET | Freq: Every day | ORAL | 13 refills | Status: DC

## 2016-07-26 MED ORDER — METRONIDAZOLE 500 MG PO TABS: ORAL_TABLET | ORAL | 0 refills | Status: DC

## 2016-07-26 NOTE — Progress Notes (Signed)
Pt. Was treated about two weeks ago through urgent care for a std and patient experienced some vomiting and did not keep antibiotic down and would like a rx for the same antibiotic.

## 2016-07-26 NOTE — Progress Notes (Signed)
Subjective:  Lisa Hickman is a 25 y.o. G2P1001 at 3766w3d being seen today for ongoing prenatal care.  She is currently monitored for the following issues for this low-risk pregnancy and has Supervision of normal pregnancy, antepartum; Drug use affecting pregnancy, antepartum; Abnormal Pap smear of cervix; and Abnormal cervical Papanicolaou smear affecting pregnancy, antepartum on her problem list. Pt was seen in MAU for N/V and this has resolved. Also noted at that time to have trich in urine. Treated with one time dose Flagyl but states threw up shortly after taking.   Patient reports no complaints.  Contractions: Not present. Vag. Bleeding: None.  Movement: Present. Denies leaking of fluid.   The following portions of the patient's history were reviewed and updated as appropriate: allergies, current medications, past family history, past medical history, past social history, past surgical history and problem list. Problem list updated.  Objective:   Vitals:   07/26/16 1009  BP: 134/83  Pulse: 77  Temp: 98.1 F (36.7 C)  Weight: 215 lb 6.4 oz (97.7 kg)    Fetal Status:     Movement: Present     General:  Alert, oriented and cooperative. Patient is in no acute distress.  Skin: Skin is warm and dry. No rash noted.   Cardiovascular: Normal heart rate noted  Respiratory: Normal respiratory effort, no problems with respiration noted  Abdomen: Soft, gravid, appropriate for gestational age. Pain/Pressure: Present     Pelvic:  Cervical exam deferred        Extremities: Normal range of motion.  Edema: None  Mental Status: Normal mood and affect. Normal behavior. Normal judgment and thought content.   Urinalysis:      Assessment and Plan:  Pregnancy: G2P1001 at 3466w3d  1. Supervision of normal pregnancy, antepartum, second trimester U/S today  2. Abnormal cervical Papanicolaou smear affecting pregnancy, antepartum Will need repeat post partum  3. Trichimoniasis  -  metroNIDAZOLE (FLAGYL) 500 MG tablet; Take two tablets by mouth twice a day, for one day.  Or you can take all four tablets at once if you can tolerate it.  Dispense: 4 tablet; Refill: 0  4. Prenatal care, second trimester  - Prenatal Vit-Fe Fumarate-FA (PREPLUS) 27-1 MG TABS; Take 1 tablet by mouth daily.  Dispense: 30 tablet; Refill: 13  Preterm labor symptoms and general obstetric precautions including but not limited to vaginal bleeding, contractions, leaking of fluid and fetal movement were reviewed in detail with the patient. Please refer to After Visit Summary for other counseling recommendations.  No Follow-up on file.   Hermina StaggersMichael L Makalyn Lennox, MD

## 2016-08-07 ENCOUNTER — Other Ambulatory Visit: Payer: Self-pay | Admitting: Obstetrics & Gynecology

## 2016-08-07 DIAGNOSIS — IMO0002 Reserved for concepts with insufficient information to code with codable children: Secondary | ICD-10-CM

## 2016-08-07 DIAGNOSIS — O35EXX0 Maternal care for other (suspected) fetal abnormality and damage, fetal genitourinary anomalies, not applicable or unspecified: Secondary | ICD-10-CM | POA: Insufficient documentation

## 2016-08-07 DIAGNOSIS — O358XX0 Maternal care for other (suspected) fetal abnormality and damage, not applicable or unspecified: Secondary | ICD-10-CM | POA: Insufficient documentation

## 2016-08-07 NOTE — Progress Notes (Signed)
Can we check on this?

## 2016-08-16 ENCOUNTER — Ambulatory Visit (INDEPENDENT_AMBULATORY_CARE_PROVIDER_SITE_OTHER): Payer: Medicaid Other | Admitting: Obstetrics & Gynecology

## 2016-08-16 VITALS — BP 109/65 | HR 71 | Temp 98.5°F | Wt 221.9 lb

## 2016-08-16 DIAGNOSIS — Z3480 Encounter for supervision of other normal pregnancy, unspecified trimester: Secondary | ICD-10-CM

## 2016-08-16 DIAGNOSIS — O358XX0 Maternal care for other (suspected) fetal abnormality and damage, not applicable or unspecified: Secondary | ICD-10-CM

## 2016-08-16 DIAGNOSIS — O35EXX0 Maternal care for other (suspected) fetal abnormality and damage, fetal genitourinary anomalies, not applicable or unspecified: Secondary | ICD-10-CM

## 2016-08-16 DIAGNOSIS — Z3482 Encounter for supervision of other normal pregnancy, second trimester: Secondary | ICD-10-CM

## 2016-08-16 NOTE — Progress Notes (Signed)
   PRENATAL VISIT NOTE  Subjective:  Lisa Hickman is a 25 y.o. G2P1001 at 1052w3d being seen today for ongoing prenatal care.  She is currently monitored for the following issues for this low-risk pregnancy and has Supervision of normal pregnancy, antepartum; Drug use affecting pregnancy, antepartum; Abnormal Pap smear of cervix; Abnormal cervical Papanicolaou smear affecting pregnancy, antepartum; and Encounter for repeat ultrasound of fetal pyelectasis in singleton pregnancy, antepartum on her problem list.  Patient reports no complaints.  Contractions: Not present. Vag. Bleeding: None.  Movement: Present. Denies leaking of fluid.   The following portions of the patient's history were reviewed and updated as appropriate: allergies, current medications, past family history, past medical history, past social history, past surgical history and problem list. Problem list updated.  Objective:   Vitals:   08/16/16 1608  BP: 109/65  Pulse: 71  Temp: 98.5 F (36.9 C)  Weight: 221 lb 14.4 oz (100.7 kg)    Fetal Status: Fetal Heart Rate (bpm): 155 Fundal Height: 25 cm Movement: Present     General:  Alert, oriented and cooperative. Patient is in no acute distress.  Skin: Skin is warm and dry. No rash noted.   Cardiovascular: Normal heart rate noted  Respiratory: Normal respiratory effort, no problems with respiration noted  Abdomen: Soft, gravid, appropriate for gestational age. Pain/Pressure: Present     Pelvic:  Cervical exam deferred        Extremities: Normal range of motion.  Edema: Trace  Mental Status: Normal mood and affect. Normal behavior. Normal judgment and thought content.   Urinalysis:      Assessment and Plan:  Pregnancy: G2P1001 at 4452w3d  1. Supervision of other normal pregnancy, antepartum Doing well  2. Encounter repeat ultrasound of fetal pyelectasis in singleton pregnancy, antepartum In 2+ weeks, feft  Preterm labor symptoms and general obstetric  precautions including but not limited to vaginal bleeding, contractions, leaking of fluid and fetal movement were reviewed in detail with the patient. Please refer to After Visit Summary for other counseling recommendations.  Return in about 4 weeks (around 09/13/2016) for need 2 hr gtt.  Adam PhenixJames G Damascus Feldpausch, MD

## 2016-09-04 ENCOUNTER — Ambulatory Visit (HOSPITAL_COMMUNITY): Admission: RE | Admit: 2016-09-04 | Payer: Medicaid Other | Source: Ambulatory Visit

## 2016-09-05 ENCOUNTER — Ambulatory Visit (HOSPITAL_COMMUNITY)
Admission: RE | Admit: 2016-09-05 | Discharge: 2016-09-05 | Disposition: A | Discharge status: Home / Self Care | Payer: Medicaid Other | Source: Ambulatory Visit | Attending: Obstetrics & Gynecology | Admitting: Obstetrics & Gynecology

## 2016-09-05 ENCOUNTER — Other Ambulatory Visit (HOSPITAL_COMMUNITY): Payer: Self-pay | Admitting: *Deleted

## 2016-09-05 ENCOUNTER — Encounter (HOSPITAL_COMMUNITY): Payer: Self-pay

## 2016-09-05 DIAGNOSIS — Z3A27 27 weeks gestation of pregnancy: Secondary | ICD-10-CM | POA: Insufficient documentation

## 2016-09-05 DIAGNOSIS — IMO0002 Reserved for concepts with insufficient information to code with codable children: Secondary | ICD-10-CM

## 2016-09-05 DIAGNOSIS — O358XX0 Maternal care for other (suspected) fetal abnormality and damage, not applicable or unspecified: Secondary | ICD-10-CM

## 2016-09-05 DIAGNOSIS — O283 Abnormal ultrasonic finding on antenatal screening of mother: Secondary | ICD-10-CM | POA: Diagnosis not present

## 2016-09-05 DIAGNOSIS — O35EXX0 Maternal care for other (suspected) fetal abnormality and damage, fetal genitourinary anomalies, not applicable or unspecified: Secondary | ICD-10-CM

## 2016-09-05 DIAGNOSIS — Z363 Encounter for antenatal screening for malformations: Secondary | ICD-10-CM | POA: Insufficient documentation
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
## 2016-09-09 DIAGNOSIS — O358XX0 Maternal care for other (suspected) fetal abnormality and damage, not applicable or unspecified: Secondary | ICD-10-CM | POA: Insufficient documentation

## 2016-09-09 DIAGNOSIS — O35EXX0 Maternal care for other (suspected) fetal abnormality and damage, fetal genitourinary anomalies, not applicable or unspecified: Secondary | ICD-10-CM | POA: Insufficient documentation

## 2016-09-09 NOTE — Addendum Note (Signed)
Encounter addended by: Willodean Rosenthalarolyn Harraway-Smith, MD on: 09/09/2016  8:18 PM<BR>    Actions taken: Visit diagnoses modified, Episode edited, Problem List modified

## 2016-09-13 ENCOUNTER — Ambulatory Visit (INDEPENDENT_AMBULATORY_CARE_PROVIDER_SITE_OTHER): Payer: Medicaid Other | Admitting: Obstetrics & Gynecology

## 2016-09-13 ENCOUNTER — Other Ambulatory Visit: Payer: Medicaid Other

## 2016-09-13 DIAGNOSIS — Z3483 Encounter for supervision of other normal pregnancy, third trimester: Secondary | ICD-10-CM

## 2016-09-13 DIAGNOSIS — Z23 Encounter for immunization: Secondary | ICD-10-CM

## 2016-09-13 DIAGNOSIS — Z3480 Encounter for supervision of other normal pregnancy, unspecified trimester: Secondary | ICD-10-CM

## 2016-09-13 MED ORDER — COMPLETENATE 29-1 MG PO CHEW: 1.0000 | CHEWABLE_TABLET | Freq: Every day | ORAL | Status: DC

## 2016-09-13 NOTE — Progress Notes (Signed)
Patient is in the office states that she is feeling good, reports good fetal movement.

## 2016-09-13 NOTE — Progress Notes (Signed)
Repeat BP 127/82   PRENATAL VISIT NOTE  Subjective:  Charna BusmanShariea Danielle Pherigo is a 25 y.o. G2P1001 at 84109w3d being seen today for ongoing prenatal care.  She is currently monitored for the following issues for this low-risk pregnancy and has Supervision of normal pregnancy, antepartum; Drug use affecting pregnancy, antepartum; Abnormal Pap smear of cervix; Abnormal cervical Papanicolaou smear affecting pregnancy, antepartum; Encounter for repeat ultrasound of fetal pyelectasis in singleton pregnancy, antepartum; and Pyelectasis of fetus on prenatal ultrasound on her problem list.  Patient reports no complaints.  Contractions: Not present. Vag. Bleeding: None.  Movement: Present. Denies leaking of fluid.   The following portions of the patient's history were reviewed and updated as appropriate: allergies, current medications, past family history, past medical history, past social history, past surgical history and problem list. Problem list updated.  Objective:   Vitals:   09/13/16 0856  BP: 137/84  Pulse: 85  Temp: 97.1 F (36.2 C)  Weight: 234 lb 9.6 oz (106.4 kg)    Fetal Status: Fetal Heart Rate (bpm): 127 Fundal Height: 29 cm Movement: Present     General:  Alert, oriented and cooperative. Patient is in no acute distress.  Skin: Skin is warm and dry. No rash noted.   Cardiovascular: Normal heart rate noted  Respiratory: Normal respiratory effort, no problems with respiration noted  Abdomen: Soft, gravid, appropriate for gestational age. Pain/Pressure: Absent     Pelvic:  Cervical exam performed        Extremities: Normal range of motion.  Edema: Trace  Mental Status: Normal mood and affect. Normal behavior. Normal judgment and thought content.   Assessment and Plan:  Pregnancy: G2P1001 at 61109w3d  1. Supervision of other normal pregnancy, antepartum Nausea with her prenatal vitamin will try chewable - prenatal vitamin w/FE, FA (NATACHEW) chewable tablet 1 tablet; Chew 1 tablet  by mouth daily at 12 noon. - Glucose Tolerance, 2 Hours w/1 Hour  Preterm labor symptoms and general obstetric precautions including but not limited to vaginal bleeding, contractions, leaking of fluid and fetal movement were reviewed in detail with the patient. Please refer to After Visit Summary for other counseling recommendations.  Return in about 2 weeks (around 09/27/2016).  Adam PhenixJames G Rayvion Stumph, MD

## 2016-09-15 LAB — HIV ANTIBODY (ROUTINE TESTING W REFLEX): HIV Screen 4th Generation wRfx: NONREACTIVE

## 2016-09-15 LAB — GLUCOSE TOLERANCE, 2 HOURS W/ 1HR
Glucose, 1 hour: 67 mg/dL (ref 65–179)
Glucose, Fasting: 47 mg/dL — ABNORMAL LOW (ref 65–91)

## 2016-09-15 LAB — CBC
Hematocrit: 31.9 % — ABNORMAL LOW (ref 34.0–46.6)
Hemoglobin: 10.9 g/dL — ABNORMAL LOW (ref 11.1–15.9)
MCH: 30.5 pg (ref 26.6–33.0)
MCHC: 34.2 g/dL (ref 31.5–35.7)
MCV: 89 fL (ref 79–97)
Platelets: 229 10*3/uL (ref 150–379)
RBC: 3.57 x10E6/uL — ABNORMAL LOW (ref 3.77–5.28)
RDW: 13.7 % (ref 12.3–15.4)
WBC: 9 10*3/uL (ref 3.4–10.8)

## 2016-09-15 LAB — RPR: RPR Ser Ql: NONREACTIVE

## 2016-09-17 ENCOUNTER — Encounter: Payer: Self-pay | Admitting: Obstetrics

## 2016-09-27 ENCOUNTER — Ambulatory Visit (INDEPENDENT_AMBULATORY_CARE_PROVIDER_SITE_OTHER): Payer: Medicaid Other | Admitting: Certified Nurse Midwife

## 2016-09-27 DIAGNOSIS — Z3480 Encounter for supervision of other normal pregnancy, unspecified trimester: Secondary | ICD-10-CM

## 2016-09-27 DIAGNOSIS — Z3483 Encounter for supervision of other normal pregnancy, third trimester: Secondary | ICD-10-CM

## 2016-09-27 NOTE — Progress Notes (Signed)
Subjective:    Lisa BusmanShariea Danielle Hickman is a 25 y.o. female being seen today for her obstetrical visit. She is at 6062w3d gestation. Patient reports no complaints. Fetal movement: normal.  Problem List Items Addressed This Visit      Other   Supervision of normal pregnancy, antepartum     Patient Active Problem List   Diagnosis Date Noted  . Pyelectasis of fetus on prenatal ultrasound 09/09/2016  . Encounter for repeat ultrasound of fetal pyelectasis in singleton pregnancy, antepartum 08/07/2016  . Abnormal Pap smear of cervix 07/09/2016  . Abnormal cervical Papanicolaou smear affecting pregnancy, antepartum 07/09/2016  . Supervision of normal pregnancy, antepartum 06/25/2016  . Drug use affecting pregnancy, antepartum 06/25/2016   Objective:    BP 118/81   Pulse 77   Wt 234 lb (106.1 kg)   LMP 02/27/2016   BMI 37.77 kg/m  FHT:  136 BPM  Uterine Size: 30 cm and size equals dates  Presentation: cephalic    Random CBG: 78 Assessment:    Pregnancy @ 6262w3d weeks   Hypoglycemia on 2 hour OGTT  fetal renal pyelectasis   Plan:    Fetus with dilated urinary tracts R:6.775mm L365mm on US: f/u US on 10/03/16   labs reviewed, problem list updated Consent signed. GBS planning TDAP offered  Rhogam given for RH negative Pediatrician: discussed. Infant feeding: plans to breastfeed. Maternity leave: discussed. Cigarette smoking: smokes THC occasionally. No orders of the defined types were placed in this encounter.  No orders of the defined types were placed in this encounter.  Follow up in 2 Weeks.

## 2016-10-03 ENCOUNTER — Encounter (HOSPITAL_COMMUNITY): Payer: Self-pay

## 2016-10-03 ENCOUNTER — Ambulatory Visit (HOSPITAL_COMMUNITY)
Admission: RE | Admit: 2016-10-03 | Discharge: 2016-10-03 | Disposition: A | Discharge status: Home / Self Care | Payer: Medicaid Other | Source: Ambulatory Visit | Attending: Obstetrics & Gynecology | Admitting: Obstetrics & Gynecology

## 2016-10-03 DIAGNOSIS — Z3A31 31 weeks gestation of pregnancy: Secondary | ICD-10-CM | POA: Insufficient documentation

## 2016-10-03 DIAGNOSIS — O35EXX0 Maternal care for other (suspected) fetal abnormality and damage, fetal genitourinary anomalies, not applicable or unspecified: Secondary | ICD-10-CM

## 2016-10-03 DIAGNOSIS — O358XX0 Maternal care for other (suspected) fetal abnormality and damage, not applicable or unspecified: Secondary | ICD-10-CM | POA: Insufficient documentation

## 2016-10-03 NOTE — ED Notes (Signed)
Pt arrived late for her appointment today.  Explained to pt that she will have to be worked back in to the schedule.  Pt voices understanding.

## 2016-10-09 ENCOUNTER — Encounter: Payer: Medicaid Other | Admitting: Certified Nurse Midwife

## 2016-10-29 ENCOUNTER — Ambulatory Visit (INDEPENDENT_AMBULATORY_CARE_PROVIDER_SITE_OTHER): Payer: Medicaid Other | Admitting: Obstetrics and Gynecology

## 2016-10-29 VITALS — BP 135/71 | HR 90 | Wt 230.0 lb

## 2016-10-29 DIAGNOSIS — Z3403 Encounter for supervision of normal first pregnancy, third trimester: Secondary | ICD-10-CM

## 2016-10-29 DIAGNOSIS — O358XX0 Maternal care for other (suspected) fetal abnormality and damage, not applicable or unspecified: Secondary | ICD-10-CM

## 2016-10-29 DIAGNOSIS — O3440 Maternal care for other abnormalities of cervix, unspecified trimester: Secondary | ICD-10-CM

## 2016-10-29 DIAGNOSIS — Z3400 Encounter for supervision of normal first pregnancy, unspecified trimester: Secondary | ICD-10-CM

## 2016-10-29 DIAGNOSIS — R87619 Unspecified abnormal cytological findings in specimens from cervix uteri: Secondary | ICD-10-CM

## 2016-10-29 DIAGNOSIS — O35EXX0 Maternal care for other (suspected) fetal abnormality and damage, fetal genitourinary anomalies, not applicable or unspecified: Secondary | ICD-10-CM

## 2016-10-29 MED ORDER — VITAFOL GUMMIES 3.33-0.333-34.8 MG PO CHEW: 3.0000 | CHEWABLE_TABLET | Freq: Every day | ORAL | 11 refills | Status: DC

## 2016-10-29 NOTE — Progress Notes (Signed)
Patient reports she has swelling- worse R foot and ankle- painful to touch and when she has swelling.

## 2016-10-29 NOTE — Progress Notes (Signed)
   PRENATAL VISIT NOTE  Subjective:  Lisa Hickman is a 25 y.o. G2P1001 at 7455w0d being seen today for ongoing prenatal care.  She is currently monitored for the following issues for this low-risk pregnancy and has Supervision of normal pregnancy, antepartum; Drug use affecting pregnancy, antepartum; Abnormal Pap smear of cervix; Abnormal cervical Papanicolaou smear affecting pregnancy, antepartum; Encounter for repeat ultrasound of fetal pyelectasis in singleton pregnancy, antepartum; and Pyelectasis of fetus on prenatal ultrasound on her problem list.  Patient reports lower extremity edema which can be painful at times.  Contractions: Not present. Vag. Bleeding: None.  Movement: Present. Denies leaking of fluid.   The following portions of the patient's history were reviewed and updated as appropriate: allergies, current medications, past family history, past medical history, past social history, past surgical history and problem list. Problem list updated.  Objective:   Vitals:   10/29/16 1001  BP: 135/71  Pulse: 90  Weight: 230 lb (104.3 kg)    Fetal Status: Fetal Heart Rate (bpm): 132 Fundal Height: 36 cm Movement: Present     General:  Alert, oriented and cooperative. Patient is in no acute distress.  Skin: Skin is warm and dry. No rash noted.   Cardiovascular: Normal heart rate noted  Respiratory: Normal respiratory effort, no problems with respiration noted  Abdomen: Soft, gravid, appropriate for gestational age. Pain/Pressure: Absent     Pelvic:  Cervical exam deferred        Extremities: Normal range of motion.  Edema: Trace. Equal in size, tenderness on inner aspect of ankle  Mental Status: Normal mood and affect. Normal behavior. Normal judgment and thought content.   Assessment and Plan:  Pregnancy: G2P1001 at 5155w0d  1. Supervision of normal first pregnancy, antepartum Patient is doing well Advised the use of support stocking to help with edema. Patient is on  her feet all day Cultures next visit   2. Abnormal cervical Papanicolaou smear affecting pregnancy, antepartum Will repeat in 1 year  3. Encounter for repeat ultrasound of fetal pyelectasis in singleton pregnancy, antepartum Resolved  Preterm labor symptoms and general obstetric precautions including but not limited to vaginal bleeding, contractions, leaking of fluid and fetal movement were reviewed in detail with the patient. Please refer to After Visit Summary for other counseling recommendations.  Return in about 1 week (around 11/05/2016).   Catalina AntiguaPeggy Channon Ambrosini, MD

## 2016-11-08 ENCOUNTER — Encounter: Payer: Medicaid Other | Admitting: Certified Nurse Midwife

## 2016-11-09 ENCOUNTER — Ambulatory Visit (INDEPENDENT_AMBULATORY_CARE_PROVIDER_SITE_OTHER): Payer: Medicaid Other | Admitting: Obstetrics and Gynecology

## 2016-11-09 ENCOUNTER — Other Ambulatory Visit (HOSPITAL_COMMUNITY)
Admission: RE | Admit: 2016-11-09 | Discharge: 2016-11-09 | Disposition: A | Discharge status: Home / Self Care | Payer: Medicaid Other | Source: Ambulatory Visit | Attending: Obstetrics and Gynecology | Admitting: Obstetrics and Gynecology

## 2016-11-09 VITALS — BP 127/80 | HR 104 | Wt 237.7 lb

## 2016-11-09 DIAGNOSIS — Z113 Encounter for screening for infections with a predominantly sexual mode of transmission: Secondary | ICD-10-CM | POA: Diagnosis not present

## 2016-11-09 DIAGNOSIS — Z3400 Encounter for supervision of normal first pregnancy, unspecified trimester: Secondary | ICD-10-CM

## 2016-11-09 DIAGNOSIS — Z3483 Encounter for supervision of other normal pregnancy, third trimester: Secondary | ICD-10-CM

## 2016-11-09 NOTE — Progress Notes (Signed)
Prenatal Visit Note Date: 11/09/2016 Clinic: [facility]  Subjective:  Lisa Hickman is a 25 y.o. G2P1001 at 9562w4d being seen today for ongoing prenatal care.  She is currently monitored for the following issues for this low-risk pregnancy and has Supervision of normal pregnancy, antepartum; Drug use affecting pregnancy, antepartum; and Abnormal cervical Papanicolaou smear affecting pregnancy, antepartum on her problem list.  Patient reports no complaints.   Contractions: Irregular. Vag. Bleeding: None.  Movement: Present. Denies leaking of fluid.   The following portions of the patient's history were reviewed and updated as appropriate: allergies, current medications, past family history, past medical history, past social history, past surgical history and problem list. Problem list updated.  Objective:   Vitals:   11/09/16 0928  BP: 127/80  Pulse: (!) 104  Weight: 237 lb 11.2 oz (107.8 kg)    Fetal Status: Fetal Heart Rate (bpm): 135 Fundal Height: 37 cm Movement: Present  Presentation: Vertex  General:  Alert, oriented and cooperative. Patient is in no acute distress.  Skin: Skin is warm and dry. No rash noted.   Cardiovascular: Normal heart rate noted  Respiratory: Normal respiratory effort, no problems with respiration noted  Abdomen: Soft, gravid, appropriate for gestational age. Pain/Pressure: Absent     Pelvic:  Cervical exam deferred        Extremities: Normal range of motion.     Mental Status: Normal mood and affect. Normal behavior. Normal judgment and thought content.   Urinalysis:      Assessment and Plan:  Pregnancy: G2P1001 at 6762w4d  1. Supervision of normal first pregnancy, antepartum Routine care. Pt unsure about BTL. D/w her r/b/a particularly regret and help with periods with LARC methods. Papers already signed  - Strep Gp B NAA - GC/Chlamydia probe amp ([place]  Preterm labor symptoms and general obstetric  precautions including but not limited to vaginal bleeding, contractions, leaking of fluid and fetal movement were reviewed in detail with the patient. Please refer to After Visit Summary for other counseling recommendations.  Return in about 1 week (around 11/16/2016) for 7-10d rob.   [place] Bingharlie Bernadean Saling, MD

## 2016-11-10 LAB — OB RESULTS CONSOLE GBS: GBS: NEGATIVE

## 2016-11-11 ENCOUNTER — Inpatient Hospital Stay (HOSPITAL_COMMUNITY)
Admission: AD | Admit: 2016-11-11 | Discharge: 2016-11-11 | Disposition: A | Discharge status: Home / Self Care | Payer: Medicaid Other | Source: Ambulatory Visit | Attending: Obstetrics and Gynecology | Admitting: Obstetrics and Gynecology

## 2016-11-11 ENCOUNTER — Encounter (HOSPITAL_COMMUNITY): Payer: Self-pay | Admitting: *Deleted

## 2016-11-11 DIAGNOSIS — Z3A36 36 weeks gestation of pregnancy: Secondary | ICD-10-CM | POA: Diagnosis not present

## 2016-11-11 DIAGNOSIS — Z3400 Encounter for supervision of normal first pregnancy, unspecified trimester: Secondary | ICD-10-CM

## 2016-11-11 DIAGNOSIS — A084 Viral intestinal infection, unspecified: Secondary | ICD-10-CM

## 2016-11-11 DIAGNOSIS — O98513 Other viral diseases complicating pregnancy, third trimester: Secondary | ICD-10-CM | POA: Insufficient documentation

## 2016-11-11 DIAGNOSIS — Z87891 Personal history of nicotine dependence: Secondary | ICD-10-CM | POA: Insufficient documentation

## 2016-11-11 DIAGNOSIS — R112 Nausea with vomiting, unspecified: Secondary | ICD-10-CM | POA: Diagnosis present

## 2016-11-11 DIAGNOSIS — O99320 Drug use complicating pregnancy, unspecified trimester: Secondary | ICD-10-CM

## 2016-11-11 DIAGNOSIS — Z885 Allergy status to narcotic agent status: Secondary | ICD-10-CM | POA: Insufficient documentation

## 2016-11-11 DIAGNOSIS — O3440 Maternal care for other abnormalities of cervix, unspecified trimester: Secondary | ICD-10-CM

## 2016-11-11 DIAGNOSIS — Z8249 Family history of ischemic heart disease and other diseases of the circulatory system: Secondary | ICD-10-CM | POA: Insufficient documentation

## 2016-11-11 DIAGNOSIS — R87619 Unspecified abnormal cytological findings in specimens from cervix uteri: Secondary | ICD-10-CM

## 2016-11-11 LAB — URINALYSIS, ROUTINE W REFLEX MICROSCOPIC
Bilirubin Urine: NEGATIVE
Glucose, UA: NEGATIVE mg/dL
Hgb urine dipstick: NEGATIVE
Ketones, ur: NEGATIVE mg/dL
Nitrite: NEGATIVE
Protein, ur: 30 mg/dL — AB
Specific Gravity, Urine: 1.018 (ref 1.005–1.030)
pH: 6 (ref 5.0–8.0)

## 2016-11-11 LAB — STREP GP B NAA: Strep Gp B NAA: NEGATIVE

## 2016-11-11 LAB — AMNISURE RUPTURE OF MEMBRANE (ROM) NOT AT ARMC: Amnisure ROM: NEGATIVE

## 2016-11-11 MED ORDER — ONDANSETRON 8 MG PO TBDP
8.0000 mg | ORAL_TABLET | Freq: Once | ORAL | Status: AC
  Administered 2016-11-11: 8 mg via ORAL
  Filled 2016-11-11: qty 1

## 2016-11-11 MED ORDER — ONDANSETRON 8 MG PO TBDP: 8.0000 mg | ORAL_TABLET | Freq: Three times a day (TID) | ORAL | 0 refills | Status: DC | PRN
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
## 2016-11-11 NOTE — MAU Note (Signed)
Signature pad not working.  Pt signed on paper. 

## 2016-11-11 NOTE — Discharge Instructions (Signed)

## 2016-11-11 NOTE — MAU Note (Signed)
Pt states that she has been up since 0430 and 0500 and has been throwing up ever since.  Pt states that when she throws up she started to hurt.  Pt states that she has had diarrhea 2 and threw up 4-5 times last night and 1 time this morning.  Pt states it started out with a little and then she threw up a big splash.  Pt states that today it is yellow and greenish.  Pt states she did not eat anything after she started throwing up last night.  Pt states that she was told by someone that she was nervous but states that nervous shouldn't cause this.

## 2016-11-12 NOTE — L&D Delivery Note (Signed)
OB Delivery Summary  26 y.o. G2P1001 at 3417w1d delivered a viable female infant in cephalic, LOA position. Was called to the room by nursing for a precipitous delivery. Upon arrival, nursing had delivered the head of the baby and the body followed afterward without difficulty. There was no nuchal cord.  Cord clamped x2 and cut. The baby was handed off to the NICU team. Placenta delivered spontaneously intact, with 3VC. Fundus firm on exam with massage and pitocin. Good hemostasis noted.  Anesthesia: Epidural Laceration: None Suture: None Good hemostasis noted. EBL: 100 cc  Mom and baby recovering in LDR.    Apgars: APGAR (1 MIN): 8   APGAR (5 MINS): 8   APGAR (10 MINS): 9   Weight: Pending    Gorden HarmsMegan Campbell, MD PGY-2 12/11/2016, 2:19 AM  OB FELLOW DELIVERY ATTESTATION  I was gloved and present for the delivery in its entirety, and I agree with the above resident's note.    Ernestina PennaNicholas Jiaire Rosebrook, MD 8:05 AM

## 2016-11-13 ENCOUNTER — Encounter: Payer: Medicaid Other | Admitting: Obstetrics and Gynecology

## 2016-11-13 LAB — GC/CHLAMYDIA PROBE AMP (~~LOC~~) NOT AT ARMC
Chlamydia: NEGATIVE
Neisseria Gonorrhea: NEGATIVE

## 2016-11-19 ENCOUNTER — Ambulatory Visit (INDEPENDENT_AMBULATORY_CARE_PROVIDER_SITE_OTHER): Payer: Medicaid Other | Admitting: Obstetrics and Gynecology

## 2016-11-19 DIAGNOSIS — Z3400 Encounter for supervision of normal first pregnancy, unspecified trimester: Secondary | ICD-10-CM

## 2016-11-19 DIAGNOSIS — Z3403 Encounter for supervision of normal first pregnancy, third trimester: Secondary | ICD-10-CM

## 2016-11-19 NOTE — Progress Notes (Signed)
   PRENATAL VISIT NOTE  Subjective:  Lisa Hickman is a 26 y.o. G2P1001 at 8675w0d being seen today for ongoing prenatal care.  She is currently monitored for the following issues for this low-risk pregnancy and has Supervision of normal pregnancy, antepartum; Drug use affecting pregnancy, antepartum; and Abnormal cervical Papanicolaou smear affecting pregnancy, antepartum on her problem list.  Patient reports no complaints.  Contractions: Irregular. Vag. Bleeding: None.  Movement: Present. Denies leaking of fluid.   The following portions of the patient's history were reviewed and updated as appropriate: allergies, current medications, past family history, past medical history, past social history, past surgical history and problem list. Problem list updated.  Objective:   Vitals:   11/19/16 1032  BP: 134/86  Pulse: 94    Fetal Status: Fetal Heart Rate (bpm): 138 Fundal Height: 37 cm Movement: Present  Presentation: Vertex  General:  Alert, oriented and cooperative. Patient is in no acute distress.  Skin: Skin is warm and dry. No rash noted.   Cardiovascular: Normal heart rate noted  Respiratory: Normal respiratory effort, no problems with respiration noted  Abdomen: Soft, gravid, appropriate for gestational age. Pain/Pressure: Absent     Pelvic:  Cervical exam performed Dilation: 1 Effacement (%): 30 Station: -3  Extremities: Normal range of motion.  Edema: Trace  Mental Status: Normal mood and affect. Normal behavior. Normal judgment and thought content.   Assessment and Plan:  Pregnancy: G2P1001 at 4775w0d  1. Supervision of normal first pregnancy, antepartum Patient is doing well Results of cultures reviewed and explained   Term labor symptoms and general obstetric precautions including but not limited to vaginal bleeding, contractions, leaking of fluid and fetal movement were reviewed in detail with the patient. Please refer to After Visit Summary for other counseling  recommendations.  Return in about 1 week (around 11/26/2016).   Catalina AntiguaPeggy Letasha Kershaw, MD

## 2016-11-26 ENCOUNTER — Ambulatory Visit (INDEPENDENT_AMBULATORY_CARE_PROVIDER_SITE_OTHER): Payer: Medicaid Other | Admitting: Obstetrics and Gynecology

## 2016-11-26 VITALS — BP 123/79 | HR 89 | Wt 238.0 lb

## 2016-11-26 DIAGNOSIS — R87619 Unspecified abnormal cytological findings in specimens from cervix uteri: Secondary | ICD-10-CM

## 2016-11-26 DIAGNOSIS — Z3A39 39 weeks gestation of pregnancy: Secondary | ICD-10-CM

## 2016-11-26 DIAGNOSIS — Z3400 Encounter for supervision of normal first pregnancy, unspecified trimester: Secondary | ICD-10-CM

## 2016-11-26 DIAGNOSIS — O3440 Maternal care for other abnormalities of cervix, unspecified trimester: Secondary | ICD-10-CM

## 2016-11-26 DIAGNOSIS — O3443 Maternal care for other abnormalities of cervix, third trimester: Secondary | ICD-10-CM

## 2016-11-26 MED ORDER — VITAFOL GUMMIES 3.33-0.333-34.8 MG PO CHEW: 2.0000 | CHEWABLE_TABLET | Freq: Every day | ORAL | 2 refills | Status: DC

## 2016-11-26 NOTE — Progress Notes (Signed)
   PRENATAL VISIT NOTE  Subjective:  Lisa Hickman is a 26 y.o. G2P1001 at 6211w0d being seen today for ongoing prenatal care.  She is currently monitored for the following issues for this low-risk pregnancy and has Supervision of normal pregnancy, antepartum; Drug use affecting pregnancy, antepartum; and Abnormal cervical Papanicolaou smear affecting pregnancy, antepartum on her problem list.  Patient reports no complaints.  Contractions: Not present. Vag. Bleeding: None.  Movement: Present. Denies leaking of fluid.   The following portions of the patient's history were reviewed and updated as appropriate: allergies, current medications, past family history, past medical history, past social history, past surgical history and problem list. Problem list updated.  Objective:   Vitals:   11/26/16 0952  BP: 123/79  Pulse: 89  Weight: 238 lb (108 kg)    Fetal Status: Fetal Heart Rate (bpm): 145 Fundal Height: 38 cm Movement: Present     General:  Alert, oriented and cooperative. Patient is in no acute distress.  Skin: Skin is warm and dry. No rash noted.   Cardiovascular: Normal heart rate noted  Respiratory: Normal respiratory effort, no problems with respiration noted  Abdomen: Soft, gravid, appropriate for gestational age. Pain/Pressure: Present     Pelvic:  Cervical exam deferred        Extremities: Normal range of motion.  Edema: Trace  Mental Status: Normal mood and affect. Normal behavior. Normal judgment and thought content.   Assessment and Plan:  Pregnancy: G2P1001 at 3111w0d  1. [redacted] weeks gestation of pregnancy - Prenatal Vit-Fe Phos-FA-Omega (VITAFOL GUMMIES) 3.33-0.333-34.8 MG CHEW; Chew 2 each by mouth daily.  Dispense: 90 tablet; Refill: 2  2. Supervision of normal first pregnancy, antepartum Patient is doing well Reassurance provided regarding bilateral lower extremity edema and the use of compression stocking Discussed IOL at 41 weeks if no labor   3.  Abnormal cervical Papanicolaou smear affecting pregnancy, antepartum   Term labor symptoms and general obstetric precautions including but not limited to vaginal bleeding, contractions, leaking of fluid and fetal movement were reviewed in detail with the patient. Please refer to After Visit Summary for other counseling recommendations.  No Follow-up on file.   Catalina AntiguaPeggy Mahum Betten, MD

## 2016-11-26 NOTE — Progress Notes (Signed)
Patient states that she has had a lot of swelling in feet and ankles or the past few days, but it is better today, reports good fetal movement.

## 2016-12-03 ENCOUNTER — Ambulatory Visit: Payer: Medicaid Other | Admitting: Obstetrics and Gynecology

## 2016-12-03 ENCOUNTER — Telehealth (HOSPITAL_COMMUNITY): Payer: Self-pay | Admitting: *Deleted

## 2016-12-03 VITALS — BP 125/77 | HR 93 | Wt 241.0 lb

## 2016-12-03 DIAGNOSIS — Z3480 Encounter for supervision of other normal pregnancy, unspecified trimester: Secondary | ICD-10-CM

## 2016-12-03 NOTE — Progress Notes (Signed)
Pt left before reactive NST could be obtained d/t child care. IOL schedule for next week Pt to return later this week for NST and OB visit

## 2016-12-03 NOTE — Telephone Encounter (Signed)
Preadmission screen  

## 2016-12-03 NOTE — Patient Instructions (Signed)

## 2016-12-03 NOTE — Progress Notes (Signed)
Patient had to pick up her daughter from school and left before we could get a reactive NST strip. She declined to come back to the office today- but did reschedule to come back this week for repeat NST.

## 2016-12-06 ENCOUNTER — Ambulatory Visit: Payer: Medicaid Other | Admitting: Obstetrics and Gynecology

## 2016-12-06 DIAGNOSIS — Z3493 Encounter for supervision of normal pregnancy, unspecified, third trimester: Secondary | ICD-10-CM

## 2016-12-06 NOTE — Progress Notes (Signed)
Patient in office for nst visit, patient has no questions or concerns at this time post dates, induction scheduled.

## 2016-12-10 ENCOUNTER — Encounter (HOSPITAL_COMMUNITY): Payer: Self-pay

## 2016-12-10 ENCOUNTER — Inpatient Hospital Stay (HOSPITAL_COMMUNITY)
Admission: RE | Admit: 2016-12-10 | Discharge: 2016-12-12 | DRG: 767 | Disposition: A | Discharge status: Home / Self Care | Payer: Medicaid Other | Source: Ambulatory Visit | Attending: Family Medicine | Admitting: Family Medicine

## 2016-12-10 DIAGNOSIS — O3440 Maternal care for other abnormalities of cervix, unspecified trimester: Secondary | ICD-10-CM

## 2016-12-10 DIAGNOSIS — O480 Post-term pregnancy: Principal | ICD-10-CM | POA: Diagnosis present

## 2016-12-10 DIAGNOSIS — F1290 Cannabis use, unspecified, uncomplicated: Secondary | ICD-10-CM | POA: Diagnosis present

## 2016-12-10 DIAGNOSIS — Z87891 Personal history of nicotine dependence: Secondary | ICD-10-CM

## 2016-12-10 DIAGNOSIS — Z302 Encounter for sterilization: Secondary | ICD-10-CM | POA: Diagnosis not present

## 2016-12-10 DIAGNOSIS — O99324 Drug use complicating childbirth: Secondary | ICD-10-CM | POA: Diagnosis present

## 2016-12-10 DIAGNOSIS — R030 Elevated blood-pressure reading, without diagnosis of hypertension: Secondary | ICD-10-CM | POA: Diagnosis not present

## 2016-12-10 DIAGNOSIS — E669 Obesity, unspecified: Secondary | ICD-10-CM | POA: Diagnosis present

## 2016-12-10 DIAGNOSIS — O99214 Obesity complicating childbirth: Secondary | ICD-10-CM | POA: Diagnosis present

## 2016-12-10 DIAGNOSIS — Z6838 Body mass index (BMI) 38.0-38.9, adult: Secondary | ICD-10-CM | POA: Diagnosis not present

## 2016-12-10 DIAGNOSIS — Z3A41 41 weeks gestation of pregnancy: Secondary | ICD-10-CM | POA: Diagnosis not present

## 2016-12-10 DIAGNOSIS — O1205 Gestational edema, complicating the puerperium: Secondary | ICD-10-CM | POA: Diagnosis present

## 2016-12-10 DIAGNOSIS — O9089 Other complications of the puerperium, not elsewhere classified: Secondary | ICD-10-CM | POA: Diagnosis not present

## 2016-12-10 DIAGNOSIS — R87619 Unspecified abnormal cytological findings in specimens from cervix uteri: Secondary | ICD-10-CM

## 2016-12-10 DIAGNOSIS — O99320 Drug use complicating pregnancy, unspecified trimester: Secondary | ICD-10-CM

## 2016-12-10 DIAGNOSIS — R609 Edema, unspecified: Secondary | ICD-10-CM | POA: Diagnosis not present

## 2016-12-10 DIAGNOSIS — Z8249 Family history of ischemic heart disease and other diseases of the circulatory system: Secondary | ICD-10-CM | POA: Diagnosis not present

## 2016-12-10 DIAGNOSIS — Z3480 Encounter for supervision of other normal pregnancy, unspecified trimester: Secondary | ICD-10-CM

## 2016-12-10 LAB — CBC
HCT: 30.5 % — ABNORMAL LOW (ref 36.0–46.0)
Hemoglobin: 10.3 g/dL — ABNORMAL LOW (ref 12.0–15.0)
MCH: 29.4 pg (ref 26.0–34.0)
MCHC: 33.8 g/dL (ref 30.0–36.0)
MCV: 87.1 fL (ref 78.0–100.0)
Platelets: 236 10*3/uL (ref 150–400)
RBC: 3.5 MIL/uL — ABNORMAL LOW (ref 3.87–5.11)
RDW: 12.9 % (ref 11.5–15.5)
WBC: 9.4 10*3/uL (ref 4.0–10.5)

## 2016-12-10 LAB — ABO/RH: ABO/RH(D): O POS

## 2016-12-10 LAB — TYPE AND SCREEN
ABO/RH(D): O POS
Antibody Screen: NEGATIVE

## 2016-12-10 LAB — RPR: RPR Ser Ql: NONREACTIVE

## 2016-12-10 MED ORDER — LACTATED RINGERS IV SOLN
INTRAVENOUS | Status: DC
  Administered 2016-12-10 (×3): via INTRAVENOUS

## 2016-12-10 MED ORDER — OXYCODONE-ACETAMINOPHEN 5-325 MG PO TABS: 2.0000 | ORAL_TABLET | ORAL | Status: DC | PRN

## 2016-12-10 MED ORDER — PHENYLEPHRINE 40 MCG/ML (10ML) SYRINGE FOR IV PUSH (FOR BLOOD PRESSURE SUPPORT)
80.0000 ug | PREFILLED_SYRINGE | INTRAVENOUS | Status: DC | PRN
  Filled 2016-12-10: qty 10

## 2016-12-10 MED ORDER — OXYTOCIN 40 UNITS IN LACTATED RINGERS INFUSION - SIMPLE MED: 2.5000 [IU]/h | INTRAVENOUS | Status: DC

## 2016-12-10 MED ORDER — TERBUTALINE SULFATE 1 MG/ML IJ SOLN: 0.2500 mg | Freq: Once | INTRAMUSCULAR | Status: DC | PRN

## 2016-12-10 MED ORDER — ACETAMINOPHEN 325 MG PO TABS: 650.0000 mg | ORAL_TABLET | ORAL | Status: DC | PRN

## 2016-12-10 MED ORDER — OXYTOCIN 40 UNITS IN LACTATED RINGERS INFUSION - SIMPLE MED
1.0000 m[IU]/min | INTRAVENOUS | Status: DC
  Administered 2016-12-10: 2 m[IU]/min via INTRAVENOUS
  Filled 2016-12-10: qty 1000

## 2016-12-10 MED ORDER — DIPHENHYDRAMINE HCL 50 MG/ML IJ SOLN: 12.5000 mg | INTRAMUSCULAR | Status: DC | PRN

## 2016-12-10 MED ORDER — FENTANYL CITRATE (PF) 100 MCG/2ML IJ SOLN
100.0000 ug | INTRAMUSCULAR | Status: DC | PRN
  Administered 2016-12-10 (×7): 100 ug via INTRAVENOUS
  Filled 2016-12-10 (×7): qty 2

## 2016-12-10 MED ORDER — ONDANSETRON HCL 4 MG/2ML IJ SOLN: 4.0000 mg | Freq: Four times a day (QID) | INTRAMUSCULAR | Status: DC | PRN

## 2016-12-10 MED ORDER — SOD CITRATE-CITRIC ACID 500-334 MG/5ML PO SOLN: 30.0000 mL | ORAL | Status: DC | PRN

## 2016-12-10 MED ORDER — MISOPROSTOL 50MCG HALF TABLET
50.0000 ug | ORAL_TABLET | ORAL | Status: DC
  Administered 2016-12-10 (×2): 50 ug via ORAL
  Filled 2016-12-10 (×2): qty 0.5

## 2016-12-10 MED ORDER — EPHEDRINE 5 MG/ML INJ: 10.0000 mg | INTRAVENOUS | Status: DC | PRN

## 2016-12-10 MED ORDER — LACTATED RINGERS IV SOLN: 500.0000 mL | Freq: Once | INTRAVENOUS | Status: DC

## 2016-12-10 MED ORDER — FENTANYL 2.5 MCG/ML BUPIVACAINE 1/10 % EPIDURAL INFUSION (WH - ANES)
14.0000 mL/h | INTRAMUSCULAR | Status: DC | PRN
  Administered 2016-12-11: 14 mL/h via EPIDURAL
  Filled 2016-12-10: qty 100

## 2016-12-10 MED ORDER — OXYTOCIN BOLUS FROM INFUSION: 500.0000 mL | Freq: Once | INTRAVENOUS | Status: DC

## 2016-12-10 MED ORDER — OXYCODONE-ACETAMINOPHEN 5-325 MG PO TABS: 1.0000 | ORAL_TABLET | ORAL | Status: DC | PRN

## 2016-12-10 MED ORDER — LACTATED RINGERS IV SOLN: 500.0000 mL | INTRAVENOUS | Status: DC | PRN

## 2016-12-10 MED ORDER — LIDOCAINE HCL (PF) 1 % IJ SOLN: 30.0000 mL | INTRAMUSCULAR | Status: DC | PRN

## 2016-12-10 MED ORDER — MISOPROSTOL 25 MCG QUARTER TABLET: 25.0000 ug | ORAL_TABLET | ORAL | Status: DC | PRN
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
## 2016-12-10 NOTE — Progress Notes (Signed)
Stopped by to see patient.  Gave first dose of fentanyl for pain 7/10.  Placed foley bulb without complications.  Continue to monitor for progress of labor. Anticipate SVD.  Patient considering epidural placement.

## 2016-12-10 NOTE — Progress Notes (Signed)
Patient ID: [name], female   DOB: 03/27/1991, 26 y.o.   MRN: [id number] Doing well, but having pain  Vitals:   12/10/16 1359 12/10/16 1613 12/10/16 1626 12/10/16 1707  BP: 132/75  (!) 149/87 131/68  Pulse: 82  93 90  Resp: 18  17 17   Temp:  97.7 F (36.5 C)    TempSrc:  Oral    Weight:      Height:       FHR reassuring UCs every 3-5 min  Dilation: 4 Effacement (%): 70 Station: -3 Presentation: Vertex Exam by:: Jammie Clink cnm  Foley bulb found to be sitting in vagina Removed.   Will start Pitocin

## 2016-12-10 NOTE — Progress Notes (Signed)
Charna BusmanShariea Danielle Potocki is a 26 y.o. G2P1001 at 1658w0d by LMP admitted for induction of labor due to Post dates. .  Subjective: Doing well  Objective: BP 132/75   Pulse 82   Temp 98.2 F (36.8 C) (Axillary)   Resp 18   Ht 5\' 7"  (1.702 m)   Wt 245 lb (111.1 kg)   LMP 02/27/2016   BMI 38.37 kg/m  No intake/output data recorded. No intake/output data recorded.  FHT:  FHR: 130 bpm, variability: moderate,  accelerations:  Present,  decelerations:  Absent UC:   irregular, every 3 minutes SVE:   Dilation: 1.5 Effacement (%): 50 Station: -2 Exam by:: Raliegh Ipatherine Stout RN  Labs: Lab Results  Component Value Date   WBC 9.4 12/10/2016   HGB 10.3 (L) 12/10/2016   HCT 30.5 (L) 12/10/2016   MCV 87.1 12/10/2016   PLT 236 12/10/2016    Assessment / Plan: Induction of labor due to postterm,  progressing well on pitocin  Labor: Progressing normally Preeclampsia:  n/a Fetal Wellbeing:  Category I Pain Control:  Labor support without medications I/D:  n/a Anticipated MOD:  NSVD  Wynelle BourgeoisMarie Charnell Peplinski 12/10/2016, 3:58 PM

## 2016-12-11 ENCOUNTER — Inpatient Hospital Stay (HOSPITAL_COMMUNITY): Payer: Medicaid Other | Admitting: Anesthesiology

## 2016-12-11 ENCOUNTER — Encounter (HOSPITAL_COMMUNITY): Payer: Self-pay

## 2016-12-11 DIAGNOSIS — Z3A41 41 weeks gestation of pregnancy: Secondary | ICD-10-CM

## 2016-12-11 DIAGNOSIS — O480 Post-term pregnancy: Secondary | ICD-10-CM

## 2016-12-11 MED ORDER — DOCUSATE SODIUM 100 MG PO CAPS
100.0000 mg | ORAL_CAPSULE | Freq: Every day | ORAL | Status: DC
  Administered 2016-12-11: 100 mg via ORAL
  Filled 2016-12-11: qty 1

## 2016-12-11 MED ORDER — OXYCODONE HCL 5 MG PO TABS
5.0000 mg | ORAL_TABLET | Freq: Once | ORAL | Status: AC
  Administered 2016-12-11: 5 mg via ORAL
  Filled 2016-12-11: qty 1

## 2016-12-11 MED ORDER — COCONUT OIL OIL: 1.0000 "application " | TOPICAL_OIL | Status: DC | PRN

## 2016-12-11 MED ORDER — OXYCODONE HCL 5 MG PO TABS
5.0000 mg | ORAL_TABLET | Freq: Once | ORAL | Status: AC
  Administered 2016-12-12: 5 mg via ORAL
  Filled 2016-12-11: qty 1

## 2016-12-11 MED ORDER — ACETAMINOPHEN 500 MG PO TABS
1000.0000 mg | ORAL_TABLET | Freq: Four times a day (QID) | ORAL | Status: DC | PRN
  Administered 2016-12-11 – 2016-12-12 (×4): 1000 mg via ORAL
  Filled 2016-12-11 (×5): qty 2

## 2016-12-11 MED ORDER — ONDANSETRON HCL 4 MG PO TABS: 4.0000 mg | ORAL_TABLET | ORAL | Status: DC | PRN

## 2016-12-11 MED ORDER — SENNOSIDES-DOCUSATE SODIUM 8.6-50 MG PO TABS
2.0000 | ORAL_TABLET | ORAL | Status: DC
  Administered 2016-12-11: 2 via ORAL
  Filled 2016-12-11: qty 2

## 2016-12-11 MED ORDER — TETANUS-DIPHTH-ACELL PERTUSSIS 5-2.5-18.5 LF-MCG/0.5 IM SUSP: 0.5000 mL | Freq: Once | INTRAMUSCULAR | Status: DC

## 2016-12-11 MED ORDER — BENZOCAINE-MENTHOL 20-0.5 % EX AERO: 1.0000 "application " | INHALATION_SPRAY | CUTANEOUS | Status: DC | PRN

## 2016-12-11 MED ORDER — DIBUCAINE 1 % RE OINT: 1.0000 "application " | TOPICAL_OINTMENT | RECTAL | Status: DC | PRN

## 2016-12-11 MED ORDER — LIDOCAINE HCL (PF) 1 % IJ SOLN
INTRAMUSCULAR | Status: DC | PRN
  Administered 2016-12-11 (×2): 5 mL

## 2016-12-11 MED ORDER — SIMETHICONE 80 MG PO CHEW
80.0000 mg | CHEWABLE_TABLET | ORAL | Status: DC | PRN
  Administered 2016-12-11 (×2): 80 mg via ORAL
  Filled 2016-12-11 (×2): qty 1

## 2016-12-11 MED ORDER — ONDANSETRON HCL 4 MG/2ML IJ SOLN: 4.0000 mg | INTRAMUSCULAR | Status: DC | PRN

## 2016-12-11 MED ORDER — IBUPROFEN 800 MG PO TABS
800.0000 mg | ORAL_TABLET | Freq: Three times a day (TID) | ORAL | Status: DC
  Administered 2016-12-11 – 2016-12-12 (×3): 800 mg via ORAL
  Filled 2016-12-11 (×3): qty 1

## 2016-12-11 MED ORDER — WITCH HAZEL-GLYCERIN EX PADS: 1.0000 "application " | MEDICATED_PAD | CUTANEOUS | Status: DC | PRN

## 2016-12-11 MED ORDER — IBUPROFEN 600 MG PO TABS
600.0000 mg | ORAL_TABLET | Freq: Four times a day (QID) | ORAL | Status: DC
  Administered 2016-12-11: 600 mg via ORAL
  Filled 2016-12-11 (×2): qty 1

## 2016-12-11 MED ORDER — ZOLPIDEM TARTRATE 5 MG PO TABS: 5.0000 mg | ORAL_TABLET | Freq: Every evening | ORAL | Status: DC | PRN

## 2016-12-11 MED ORDER — PRENATAL MULTIVITAMIN CH
1.0000 | ORAL_TABLET | Freq: Every day | ORAL | Status: DC
  Filled 2016-12-11: qty 1

## 2016-12-11 MED ORDER — DIPHENHYDRAMINE HCL 25 MG PO CAPS: 25.0000 mg | ORAL_CAPSULE | Freq: Four times a day (QID) | ORAL | Status: DC | PRN

## 2016-12-11 NOTE — Anesthesia Preprocedure Evaluation (Signed)

## 2016-12-11 NOTE — Progress Notes (Signed)
Went to evaluate patient due to bilateral lower extremity edema that is non-pitting, but present up to the level of the upper thighs bilaterally, slightly worse on the right compared to the left. Patient reporting worse pain in the right lower extremity compared to the left as well. Homan's sign with diffuse, crampy pain and discomfort. RLE DVT ultrasound ordered and compression device placed.   Patient then called out with abdominal pain that was described as intense cramping a 13/10. She had already received tylenol and ibuprofen. Would like to avoid opiates, but patient just received NSAIDs and crying out in discomfort. Nursing already had epidural site evaluated by CRNA as well, without concern.   Will provide one time dose of oxycodone. DVT US will be performed in the am due to inability to perform overnight. No chest pain or shortness of breath.

## 2016-12-11 NOTE — Anesthesia Postprocedure Evaluation (Signed)
Anesthesia Post Note  Patient: Lisa Hickman  Procedure(s) Performed: * No procedures listed *  Patient location during evaluation: Mother Baby Anesthesia Type: Epidural Level of consciousness: awake and awake and alert Pain management: pain level controlled Vital Signs Assessment: post-procedure vital signs reviewed and stable Respiratory status: spontaneous breathing Cardiovascular status: stable Postop Assessment: no headache, no backache, epidural receding and patient able to bend at knees Anesthetic complications: no        Last Vitals:  Vitals:   12/11/16 0433 12/11/16 0537  BP: 127/78 133/76  Pulse: 73 76  Resp: 18 18  Temp: 37.2 C 37.1 C    Last Pain:  Vitals:   12/11/16 0649  TempSrc:   PainSc: 7    Pain Goal:                 Edison PaceWILKERSON,[name]

## 2016-12-11 NOTE — Progress Notes (Signed)
Patient continually complaining of pain 10/10, even after 800 Ibuprofen given instead of 600mg  Ibuprofen.  MD called again; reported that patient is complaining of pain and tenderness in her legs, pain in her lower abdomen, and pain in her back.  The pain in her back she reports is sharp, with tenderness in her legs and sharp pain in her lower abdomen.  Will continue to monitor.   Vivi MartensAshley Kathrin Folden RN

## 2016-12-11 NOTE — Anesthesia Procedure Notes (Signed)
Epidural Patient location during procedure: OB  Staffing Anesthesiologist: Tabathia Knoche Performed: anesthesiologist   Preanesthetic Checklist Completed: patient identified, site marked, surgical consent, pre-op evaluation, timeout performed, IV checked, risks and benefits discussed and monitors and equipment checked  Epidural Patient position: sitting Prep: DuraPrep Patient monitoring: heart rate, continuous pulse ox and blood pressure Approach: midline Location: L3-L4 Injection technique: LOR saline  Needle:  Needle type: Tuohy  Needle gauge: 17 G Needle length: 9 cm and 9 Needle insertion depth: 6 cm Catheter type: closed end flexible Catheter size: 20 Guage Catheter at skin depth: 10 cm Test dose: negative  Assessment Events: blood not aspirated, injection not painful, no injection resistance, negative IV test and no paresthesia  Additional Notes Patient identified. Risks/Benefits/Options discussed with patient including but not limited to bleeding, infection, nerve damage, paralysis, failed block, incomplete pain control, headache, blood pressure changes, nausea, vomiting, reactions to medication both or allergic, itching and postpartum back pain. Confirmed with bedside nurse the patient's most recent platelet count. Confirmed with patient that they are not currently taking any anticoagulation, have any bleeding history or any family history of bleeding disorders. Patient expressed understanding and wished to proceed. All questions were answered. Sterile technique was used throughout the entire procedure. Please see nursing notes for vital signs. Test dose was given through epidural needle and negative prior to continuing to dose epidural or start infusion. Warning signs of high block given to the patient including shortness of breath, tingling/numbness in hands, complete motor block, or any concerning symptoms with instructions to call for help. Patient was given instructions  on fall risk and not to get out of bed. All questions and concerns addressed with instructions to call with any issues.     

## 2016-12-11 NOTE — Progress Notes (Signed)
MD and RN at bedside with Pt to discuss her plan of care including pain control and BTL time tomorrow.

## 2016-12-11 NOTE — Progress Notes (Signed)
RN called Dr. Orvan Falconerampbell  Regarding patient's request to delay tubal due to desiring to eat. RN stated that patient wanted to do tubal on day two. Doctor stated that she will attempt to schedule patient for tubal on day two but if not it may have to be done outpatient.  Day shift will report off to night shift to order NPO diet after midnight.

## 2016-12-11 NOTE — Progress Notes (Signed)
S: Patient seen & examined for progress of labor. Patient comfortable with epidural.    O:  Vitals:   12/11/16 0111 12/11/16 0114 12/11/16 0116 12/11/16 0119  BP: 127/79 127/79 120/70 120/70  Pulse: 90 90 84 84  Resp: 16 16    Temp:      TempSrc:      Weight:      Height:        Dilation: 6 Effacement (%): 80 Station: -1 Presentation: Vertex Exam by:: Sade Harper,RN   FHT: 130sbpm, mod var, +accels, occasional variable decels and periods of difficulty tracing TOCO: patient is feeling ctx regularly, but irregular pattern picked up on monitor   A/P: Pitocin  Just received epidural and appears comfortable Consider AROM pending continued reassurance from FHT Continue expectant management Anticipate SVD   Gorden HarmsMegan Danniella Robben, MD PGY-2 12/11/2016 1:32 AM

## 2016-12-12 ENCOUNTER — Encounter (HOSPITAL_COMMUNITY): Payer: Self-pay

## 2016-12-12 ENCOUNTER — Encounter (HOSPITAL_COMMUNITY)
Admission: RE | Disposition: A | Discharge status: Home / Self Care | Payer: Self-pay | Source: Ambulatory Visit | Attending: Family Medicine

## 2016-12-12 ENCOUNTER — Inpatient Hospital Stay (HOSPITAL_COMMUNITY): Payer: Medicaid Other | Admitting: Anesthesiology

## 2016-12-12 ENCOUNTER — Inpatient Hospital Stay (HOSPITAL_COMMUNITY): Payer: Medicaid Other

## 2016-12-12 DIAGNOSIS — Z302 Encounter for sterilization: Secondary | ICD-10-CM

## 2016-12-12 DIAGNOSIS — R609 Edema, unspecified: Secondary | ICD-10-CM

## 2016-12-12 HISTORY — PX: TUBAL LIGATION: SHX77

## 2016-12-12 LAB — CBC
HCT: 31.3 % — ABNORMAL LOW (ref 36.0–46.0)
Hemoglobin: 10.7 g/dL — ABNORMAL LOW (ref 12.0–15.0)
MCH: 29.7 pg (ref 26.0–34.0)
MCHC: 34.2 g/dL (ref 30.0–36.0)
MCV: 86.9 fL (ref 78.0–100.0)
Platelets: 225 10*3/uL (ref 150–400)
RBC: 3.6 MIL/uL — ABNORMAL LOW (ref 3.87–5.11)
RDW: 13.1 % (ref 11.5–15.5)
WBC: 10.1 10*3/uL (ref 4.0–10.5)

## 2016-12-12 LAB — COMPREHENSIVE METABOLIC PANEL
ALT: 16 U/L (ref 14–54)
AST: 26 U/L (ref 15–41)
Albumin: 2.7 g/dL — ABNORMAL LOW (ref 3.5–5.0)
Alkaline Phosphatase: 161 U/L — ABNORMAL HIGH (ref 38–126)
Anion gap: 8 (ref 5–15)
BUN: 6 mg/dL (ref 6–20)
CO2: 22 mmol/L (ref 22–32)
Calcium: 8.2 mg/dL — ABNORMAL LOW (ref 8.9–10.3)
Chloride: 106 mmol/L (ref 101–111)
Creatinine, Ser: 0.89 mg/dL (ref 0.44–1.00)
GFR calc Af Amer: >60 mL/min (ref >60)
GFR calc non Af Amer: >60 mL/min (ref >60)
Glucose, Bld: 133 mg/dL — ABNORMAL HIGH (ref 65–99)
Potassium: 3.9 mmol/L (ref 3.5–5.1)
Sodium: 136 mmol/L (ref 135–145)
Total Bilirubin: 0.5 mg/dL (ref 0.3–1.2)
Total Protein: 6.1 g/dL — ABNORMAL LOW (ref 6.5–8.1)

## 2016-12-12 LAB — PROTEIN / CREATININE RATIO, URINE
Creatinine, Urine: 76 mg/dL
Protein Creatinine Ratio: 0.11 mg/mg{Cre} (ref 0.00–0.15)
Total Protein, Urine: 8 mg/dL

## 2016-12-12 SURGERY — LIGATION, FALLOPIAN TUBE, POSTPARTUM
Anesthesia: Choice | Laterality: Bilateral

## 2016-12-12 SURGERY — LIGATION, FALLOPIAN TUBE, POSTPARTUM
Anesthesia: Spinal | Laterality: Bilateral

## 2016-12-12 MED ORDER — PROMETHAZINE HCL 25 MG/ML IJ SOLN: 6.2500 mg | INTRAMUSCULAR | Status: DC | PRN

## 2016-12-12 MED ORDER — ONDANSETRON HCL 4 MG/2ML IJ SOLN
INTRAMUSCULAR | Status: DC | PRN
  Administered 2016-12-12: 4 mg via INTRAVENOUS

## 2016-12-12 MED ORDER — OXYCODONE HCL 5 MG PO TABS
5.0000 mg | ORAL_TABLET | ORAL | Status: DC | PRN
  Administered 2016-12-12: 5 mg via ORAL
  Filled 2016-12-12: qty 1

## 2016-12-12 MED ORDER — BUPIVACAINE HCL (PF) 0.25 % IJ SOLN
INTRAMUSCULAR | Status: DC | PRN
  Administered 2016-12-12: 30 mL

## 2016-12-12 MED ORDER — HYDROCHLOROTHIAZIDE 12.5 MG PO TABS: 25.0000 mg | ORAL_TABLET | Freq: Every day | ORAL | 0 refills | Status: DC

## 2016-12-12 MED ORDER — DEXAMETHASONE SODIUM PHOSPHATE 4 MG/ML IJ SOLN
INTRAMUSCULAR | Status: AC
  Filled 2016-12-12: qty 1

## 2016-12-12 MED ORDER — LACTATED RINGERS IV SOLN
INTRAVENOUS | Status: DC
  Administered 2016-12-12 (×2): via INTRAVENOUS

## 2016-12-12 MED ORDER — MIDAZOLAM HCL 2 MG/2ML IJ SOLN
INTRAMUSCULAR | Status: AC
  Filled 2016-12-12: qty 2

## 2016-12-12 MED ORDER — DOCUSATE SODIUM 100 MG PO CAPS: 100.0000 mg | ORAL_CAPSULE | Freq: Every day | ORAL | 0 refills | Status: DC

## 2016-12-12 MED ORDER — BUPIVACAINE HCL (PF) 0.25 % IJ SOLN
INTRAMUSCULAR | Status: AC
  Filled 2016-12-12: qty 30

## 2016-12-12 MED ORDER — DEXAMETHASONE SODIUM PHOSPHATE 4 MG/ML IJ SOLN
INTRAMUSCULAR | Status: DC | PRN
  Administered 2016-12-12: 4 mg via INTRAVENOUS

## 2016-12-12 MED ORDER — FAMOTIDINE 20 MG PO TABS
40.0000 mg | ORAL_TABLET | Freq: Once | ORAL | Status: AC
  Administered 2016-12-12: 40 mg via ORAL
  Filled 2016-12-12: qty 2

## 2016-12-12 MED ORDER — SCOPOLAMINE 1 MG/3DAYS TD PT72
MEDICATED_PATCH | TRANSDERMAL | Status: DC | PRN
  Administered 2016-12-12: 1 via TRANSDERMAL

## 2016-12-12 MED ORDER — METOCLOPRAMIDE HCL 10 MG PO TABS
10.0000 mg | ORAL_TABLET | Freq: Once | ORAL | Status: AC
  Administered 2016-12-12: 10 mg via ORAL
  Filled 2016-12-12: qty 1

## 2016-12-12 MED ORDER — SCOPOLAMINE 1 MG/3DAYS TD PT72
MEDICATED_PATCH | TRANSDERMAL | Status: AC
  Filled 2016-12-12: qty 1

## 2016-12-12 MED ORDER — ONDANSETRON HCL 4 MG/2ML IJ SOLN
INTRAMUSCULAR | Status: AC
  Filled 2016-12-12: qty 2

## 2016-12-12 MED ORDER — MIDAZOLAM HCL 2 MG/2ML IJ SOLN
INTRAMUSCULAR | Status: DC | PRN
  Administered 2016-12-12: 2 mg via INTRAVENOUS

## 2016-12-12 MED ORDER — FENTANYL CITRATE (PF) 100 MCG/2ML IJ SOLN: 25.0000 ug | INTRAMUSCULAR | Status: DC | PRN

## 2016-12-12 MED ORDER — OXYCODONE HCL 5 MG PO TABS: 5.0000 mg | ORAL_TABLET | ORAL | 0 refills | Status: DC | PRN

## 2016-12-12 SURGICAL SUPPLY — 26 items
BLADE SURG 11 STRL SS (BLADE) ×3 IMPLANT
CHLORAPREP W/TINT 26ML (MISCELLANEOUS) ×3 IMPLANT
CLIP FILSHIE TUBAL LIGA STRL (Clip) ×3 IMPLANT
CLOTH BEACON ORANGE TIMEOUT ST (SAFETY) ×3 IMPLANT
DRSG OPSITE POSTOP 3X4 (GAUZE/BANDAGES/DRESSINGS) ×3 IMPLANT
DURAPREP 26ML APPLICATOR (WOUND CARE) IMPLANT
GLOVE BIO SURGEON STRL SZ 6.5 (GLOVE) ×2 IMPLANT
GLOVE BIO SURGEONS STRL SZ 6.5 (GLOVE) ×1
GLOVE BIOGEL PI IND STRL 7.0 (GLOVE) ×2 IMPLANT
GLOVE BIOGEL PI INDICATOR 7.0 (GLOVE) ×4
GOWN STRL REUS W/TWL LRG LVL3 (GOWN DISPOSABLE) ×6 IMPLANT
NEEDLE HYPO 22GX1.5 SAFETY (NEEDLE) ×3 IMPLANT
NS IRRIG 1000ML POUR BTL (IV SOLUTION) ×3 IMPLANT
PACK ABDOMINAL MINOR (CUSTOM PROCEDURE TRAY) ×3 IMPLANT
PROTECTOR NERVE ULNAR (MISCELLANEOUS) IMPLANT
SPONGE LAP 4X18 X RAY DECT (DISPOSABLE) IMPLANT
SUT VIC AB 0 CT1 27 (SUTURE) ×2
SUT VIC AB 0 CT1 27XBRD ANBCTR (SUTURE) ×1 IMPLANT
SUT VICRYL 4-0 PS2 18IN ABS (SUTURE) ×3 IMPLANT
SYR CONTROL 10ML LL (SYRINGE) ×3 IMPLANT
TOWEL OR 17X24 6PK STRL BLUE (TOWEL DISPOSABLE) ×3 IMPLANT
TRAY FOLEY CATH SILVER 16FR (SET/KITS/TRAYS/PACK) ×3 IMPLANT
TUBING NON-CON 1/4 X 20 CONN (TUBING) ×2 IMPLANT
TUBING NON-CON 1/4 X 20' CONN (TUBING) ×1
WATER STERILE IRR 1000ML POUR (IV SOLUTION) ×3 IMPLANT
YANKAUER SUCT BULB TIP NO VENT (SUCTIONS) ×3 IMPLANT

## 2016-12-12 NOTE — Progress Notes (Signed)
Post Partum Day 1 Subjective: no complaints, up ad lib, voiding and tolerating PO  Objective: Blood pressure 139/78, pulse 63, temperature 97.4 F (36.3 C), temperature source Oral, resp. rate 18, height 5\' 7"  (1.702 m), weight 245 lb (111.1 kg), last menstrual period 02/27/2016, SpO2 100 %, unknown if currently breastfeeding.  Physical Exam:  General: alert, cooperative and appears stated age Lochia: appropriate Uterine Fundus: firm DVT Evaluation: Negative Homan's sign. No cords or calf tenderness. R>L edema to sacrum   Recent Labs  12/10/16 0831  HGB 10.3*  HCT 30.5*   RLE u/s is negative for DVT.  Assessment/Plan: Breastfeeding and Contraception BTL Plan for D/C later today if doing well.  LOS: 2 days   Lisa Boresanya S Jazman Hickman 12/12/2016, 9:19 AM

## 2016-12-12 NOTE — Progress Notes (Signed)
Patient ID: [name], female   DOB: 09/26/1991, 26 y.o.   MRN: [id number]  S: Was called due to patient having persistently high blood pressure readings after her operation today (postpartum tubal ligation). Saw patient in PACU, she is feeling comfortable, denies any headaches, vision changes, RUQ/epigastric pain. She asked when she could eat and if she could go home.   O:  Vitals:   12/12/16 1200 12/12/16 1215 12/12/16 1230 12/12/16 1300  BP: (!) 161/92 (!) 148/103  (!) 149/105  Pulse: 75 60  61  Resp: 16 16 16 16   Temp:    98.1 F (36.7 C)  TempSrc:      SpO2: 100% 98%  100%  Weight:      Height:        Constitutional: She is oriented to person, place, and time. She appears well-developed and well-nourished. Calm.  HEENT: Non-icteric; EOMI; Normocephalic.  Cardiovascular: Normal rate, regular rhythm and normal heart sounds, no murmurs, rubs, gallops.  Pulmonary/Chest: Effort normal and breath sounds normal. CTAB. Abdominal: Soft. Non-tender. Incision site at umbilicus is clean, dry, intact.  Neurological: She is alert and oriented to person, place, and time. She has normal reflexes.  Skin: Skin is warm and dry.  Musculoskeletal: No edema. Steady gait.   Psychiatric: She has a normal mood and affect. Her behavior is normal. Judgment and thought content normal.  A/P: Will monitor BPs, if remains elevated after transfer to mother baby unit, will obtain preE labs. Will keep for this afternoon and reassess this evening if even able to go home today, but likely discharge home tomorrow.  Cleda ClarksElizabeth W. Shyloh Derosa, DO  OB Fellow [facility], [facility]

## 2016-12-12 NOTE — Progress Notes (Signed)
Pt brought to [facility] at 1330. BP 155/102, HR 62, pain 3/10.  Pt asymptomatic.  Dr. Omer JackMumaw notified and ordered to recheck in 15 minutes.  Recheck at 1358 was BP 147/101, HR 66. Pt asymptomatic. Dr. Omer JackMumaw notified and told RN she would order Pre-e labs.  Will continue to monitor. BP now trending down.

## 2016-12-12 NOTE — OR Nursing (Signed)
Patient received to Short Stay Unit via stretcher, awake and alert, IV flushed, infusing well. Vital signs stable, permit signed prior to arrival and on chart. Epidural in place.

## 2016-12-12 NOTE — Interval H&P Note (Signed)
History and Physical Interval Note:  12/12/2016 9:15 AM  Lisa Hickman  has presented today for surgery, with the diagnosis of desires sterilization. She is now s/p SVD. The various methods of treatment have been discussed with the patient and family. After consideration of risks, benefits and other options for treatment, the patient has consented to  Procedure(s): POST PARTUM TUBAL LIGATION (Bilateral) as a surgical intervention.  The patient's history has been reviewed, patient examined, no change in status, stable for surgery.  I have reviewed the patient's chart and labs. Risks include but are not limited to bleeding, infection, injury to surrounding structures, including bowel, bladder and ureters, blood clots, and death.  Likelihood of success is high. Patient counseled, r.e. Risks benefits of BTL, including permanency of procedure, risk of failure(1:100), increased risk of ectopic.  Patient verbalized understanding and desires to proceed  Questions were answered to the patient's satisfaction.     Lisa Hickman

## 2016-12-12 NOTE — Progress Notes (Signed)
Pt I&O cath at 1600 to obtain urine specimen for urine protein/creatine ratio per MD order.  1 attempt, with clear yellow urine. 350 mls.  Specimen sent to lab.

## 2016-12-12 NOTE — Discharge Summary (Signed)
OB Discharge Summary     Patient Name: Lisa Hickman DOB: Aug 27, 1991 MRN: [id number]  Date of admission: 12/10/2016 Delivering MD: Lorne Skeens   Date of discharge: 12/12/2016  Admitting diagnosis: INDUCTION Desire Steriliziation  desires sterilization Intrauterine pregnancy: [redacted]w[redacted]d     Secondary diagnosis:  Active Problems:   Post-dates pregnancy   Encounter for sterilization  Additional problems: elevated blood pressures without preeclampsia.  Bilateral LE edema R>L with negative DVT study.      Discharge diagnosis: Term Pregnancy Delivered                                                                                                Post partum procedures: BTL  Augmentation: Pitocin and misoprostol  Complications: None  Hospital course:  Induction of Labor With Vaginal Delivery   26 y.o. yo [id number] at [redacted]w[redacted]d was admitted to the hospital 12/10/2016 for induction of labor.  Indication for induction: Postdates.  Patient had an uncomplicated labor course as follows: Membrane Rupture Time/Date: 1:14 AM ,12/11/2016   Intrapartum Procedures: Episiotomy: None [1]                                         Lacerations:  None [1]  Patient had delivery of a Viable infant.  Information for the patient's newborn:  Joelyn, Lover [[id number]]  Delivery Method: Vag-Spont   12/11/2016  Details of delivery can be found in separate delivery note.  Patient had a routine postpartum course, was found to have elevated BPs after postpartum tubal today, no sign/symptoms of preeclampsia, labs were negative. Patient was sent home with antihypertensives and baby love to follow up with BP checks on day #3 and #7, as well as a BP check in office in 1 week. Patient is discharged home 12/12/16.  Physical exam  Vitals:   12/12/16 1600 12/12/16 1700 12/12/16 1810 12/12/16 1923  BP: 130/81 133/82 122/85 (!) 145/87  Pulse: 66 63 65   Resp:   16   Temp:   98.1 F (36.7 C)   TempSrc:    Oral   SpO2:   100%   Weight:      Height:       General: alert and cooperative Lochia: appropriate Uterine Fundus: firm Incision: N/A DVT Evaluation: No evidence of DVT seen on physical exam. Labs: Lab Results  Component Value Date   WBC 10.1 12/12/2016   HGB 10.7 (L) 12/12/2016   HCT 31.3 (L) 12/12/2016   MCV 86.9 12/12/2016   PLT 225 12/12/2016   CMP Latest Ref Rng & Units 12/12/2016  Glucose 65 - 99 mg/dL 130(Q)  BUN 6 - 20 mg/dL 6  Creatinine 6.57 - 8.46 mg/dL 9.62  Sodium 952 - 841 mmol/L 136  Potassium 3.5 - 5.1 mmol/L 3.9  Chloride 101 - 111 mmol/L 106  CO2 22 - 32 mmol/L 22  Calcium 8.9 - 10.3 mg/dL 8.2(L)  Total Protein 6.5 - 8.1 g/dL 6.1(L)  Total Bilirubin 0.3 - 1.2 mg/dL 0.5  Alkaline Phos 38 - 126 U/L 161(H)  AST 15 - 41 U/L 26  ALT 14 - 54 U/L 16    Discharge instruction: per After Visit Summary and "Baby and Me Booklet".  After visit meds:  Allergies as of 12/12/2016      Reactions   Food Diarrhea, Nausea And Vomiting, Swelling, Other (See Comments)   Pt is allergic to crab legs.   Reaction:  All over body swelling   Vicodin [hydrocodone-acetaminophen] Nausea And Vomiting      Medication List    STOP taking these medications   ondansetron 8 MG disintegrating tablet Commonly known as:  ZOFRAN ODT   VITAFOL GUMMIES 3.33-0.333-34.8 MG Chew     TAKE these medications   docusate sodium 100 MG capsule Commonly known as:  COLACE Take 1 capsule (100 mg total) by mouth daily.   hydrochlorothiazide 12.5 MG tablet Commonly known as:  HYDRODIURIL Take 2 tablets (25 mg total) by mouth daily.   oxyCODONE 5 MG immediate release tablet Commonly known as:  Oxy IR/ROXICODONE Take 1 tablet (5 mg total) by mouth every 4 (four) hours as needed for severe pain.       Diet: routine diet  Activity: Advance as tolerated. Pelvic rest for 6 weeks.   Outpatient follow up: One week BP follow up at clinic. Also has baby love visits for day 3 and day 7.   Follow up Appt:Future Appointments Date Time Provider Department Center  01/21/2017 9:15 AM Catalina AntiguaPeggy Constant, MD [facility] None   Follow up Visit:No Follow-up on file.  Postpartum contraception: Tubal Ligation  Newborn Data: Live born female  Birth Weight: 8 lb 5.7 oz (3790 g) APGAR: 8, 8  Baby Feeding: Bottle Disposition:home with mother   12/12/2016 Renne Muscaaniel L Warden, MD   OB FELLOW DISCHARGE ATTESTATION  I have seen and examined this patient and agree with above documentation in the resident's note. Patient had a routine postpartum course, was found to have elevated BPs after postpartum tubal today, no sign/symptoms of preeclampsia, labs were negative. Patient was sent home with antihypertensives and baby love to follow up with BP checks on day #3 and #7, as well as a BP check in office in 1 week. Educated about preeclampsia sign/symptoms and when to return to ED.   Jen MowElizabeth Rosaisela Jamroz, DO OB Fellow 11:41 PM

## 2016-12-12 NOTE — Progress Notes (Signed)
Pt reports no pain, has voided 350 ml clear yellow urine since I&O cath at 1600.  At 1810 BP 122/85 HR 65.  [facility]. Pt denies any headache or vision changes with every BP assessment.

## 2016-12-12 NOTE — Progress Notes (Signed)
Notified Dr. Myrtie SomanWarden of pt's last BP 145/87 and pt's persistent request to go home tonight. (Pt agitated and irritated when BP reading obtained, stating she just wants to go home.) Dr. Myrtie SomanWarden on floor to see pt.

## 2016-12-12 NOTE — Progress Notes (Signed)
CSW acknowledges consult.  CSW attempted to meet with MOB, however MOB was in surgery.  CSW will attempt to visit with MOB at a later time.   Yamili Lichtenwalner Boyd-Gilyard, MSW, LCSW Clinical Social Work [phone]  

## 2016-12-12 NOTE — Transfer of Care (Signed)
Immediate Anesthesia Transfer of Care Note  Patient: Lisa BusmanShariea Danielle Hickman  Procedure(s) Performed: Procedure(s): POST PARTUM TUBAL LIGATION (Bilateral)  Patient Location: PACU  Anesthesia Type:Spinal  Level of Consciousness: awake, alert  and oriented  Airway & Oxygen Therapy: Patient Spontanous Breathing  Post-op Assessment: Report given to RN and Post -op Vital signs reviewed and stable  Post vital signs: Reviewed and stable  Last Vitals:  Vitals:   12/12/16 0632 12/12/16 0810  BP: 121/70 139/78  Pulse: (!) 57 63  Resp: 18 18  Temp: 36.7 C 36.3 C    Last Pain:  Vitals:   12/12/16 0810  TempSrc: Oral  PainSc: 4       Patients Stated Pain Goal: 4 (12/12/16 0810)  Complications: No apparent anesthesia complications

## 2016-12-12 NOTE — Op Note (Addendum)
Lisa BusmanShariea Danielle Hickman  12/10/2016 - 12/12/2016  PREOPERATIVE DIAGNOSIS:  Multiparity, undesired fertility  POSTOPERATIVE DIAGNOSIS:  Multiparity, undesired fertility  PROCEDURE:  Postpartum Bilateral Tubal Sterilization using Filshie Clips   ANESTHESIA:  Epidural and local analgesia using 0.25% Marcaine  ASSISTANT: Jen MowElizabeth Mumaw, DO  COMPLICATIONS:  None immediate.  ESTIMATED BLOOD LOSS: 50 ml.  INDICATIONS: 26 y.o. [id number]  with undesired fertility,status post vaginal delivery, desires permanent sterilization.  Other reversible forms of contraception were discussed with patient; she declines all other modalities. Risks of procedure discussed with patient including but not limited to: risk of regret, permanence of method, bleeding, infection, injury to surrounding organs and need for additional procedures.  Failure risk of 0.5-1% with increased risk of ectopic gestation if pregnancy occurs was also discussed with patient.     FINDINGS:  Normal uterus, tubes, and ovaries.  PROCEDURE DETAILS: The patient was taken to the operating room where her spinal anesthesia was dosed up to surgical level and found to be adequate.  She was then placed in a supine position and prepped and draped in the usual sterile fashion.  After an adequate timeout was performed, attention was turned to the patient's abdomen where a small transverse skin incision was made under the umbilical fold. The incision was taken down to the layer of fascia using the scalpel, and fascia was incised, and extended bilaterally. The peritoneum was entered in a sharp fashion. The patient was placed in Trendelenburg.  The rightt fallopian tube was identified and grasped with a Babcock clamp, and followed out to the fimbriated end.  A Filshie clip was placed on the left fallopian tube about 2 cm from the cornu.  A similar process was carried out on the left side allowing for bilateral tubal sterilization.  Good hemostasis was noted  overall.  Local analgesia was drizzled onto both Filshie application sites.The instruments were then removed from the patient's abdomen and the fascial incision was repaired with 0 Vicryl, and the skin was closed with a 4-0 Vicryl subcuticular stitch. The patient tolerated the procedure well.  Sponge, lap, and needle counts were correct times two.  The patient was then taken to the recovery room awake, extubated and in stable condition.  [name] MD 12/12/2016 10:12 AM

## 2016-12-12 NOTE — Progress Notes (Signed)
*  Preliminary Results* Right lower extremity venous duplex completed. Right lower extremity is negative for deep vein thrombosis. There is no evidence of right Baker's cyst.  12/12/2016 9:06 AM  Gertie FeyMichelle Kissie Ziolkowski, BS, RVT, RDCS, RDMS

## 2016-12-12 NOTE — Anesthesia Procedure Notes (Signed)
Spinal  Patient location during procedure: OR Start time: 12/12/2016 9:38 AM End time: 12/12/2016 9:41 AM Staffing Anesthesiologist: Cecile HearingURK, Karina Nofsinger EDWARD Performed: anesthesiologist  Preanesthetic Checklist Completed: patient identified, surgical consent, pre-op evaluation, timeout performed, IV checked, risks and benefits discussed and monitors and equipment checked Spinal Block Patient position: sitting Prep: site prepped and draped and DuraPrep Patient monitoring: continuous pulse ox and blood pressure Approach: midline Location: L4-5 Needle Needle type: Pencan  Needle gauge: 25 G Needle length: 9 cm Assessment Sensory level: T6 Additional Notes Functioning IV was confirmed and monitors were applied. Sterile prep and drape, including hand hygiene, mask and sterile gloves were used. The patient was positioned and the spine was prepped. The skin was anesthetized with lidocaine.  Free flow of clear CSF was obtained prior to injecting local anesthetic into the CSF.  The spinal needle aspirated freely following injection.  The needle was carefully withdrawn.  The patient tolerated the procedure well. Consent was obtained prior to procedure with all questions answered and concerns addressed. Risks including but not limited to bleeding, infection, nerve damage, paralysis, failed block, inadequate analgesia, allergic reaction, high spinal, itching and headache were discussed and the patient wished to proceed.   Arrie AranStephen Priti Consoli, MD

## 2016-12-12 NOTE — Progress Notes (Signed)
CRNA called to evaluate patient's pain at her epidural site.  Some old, bloody drainage noted, CRNA confirms everything wnl, reassured patient.

## 2016-12-12 NOTE — Anesthesia Postprocedure Evaluation (Signed)
Anesthesia Post Note  Patient: Lisa Hickman  Procedure(s) Performed: Procedure(s) (LRB): POST PARTUM TUBAL LIGATION (Bilateral)  Patient location during evaluation: PACU Anesthesia Type: Spinal Level of consciousness: oriented and awake and alert Pain management: pain level controlled Vital Signs Assessment: post-procedure vital signs reviewed and stable Respiratory status: spontaneous breathing, respiratory function stable and patient connected to nasal cannula oxygen Cardiovascular status: blood pressure returned to baseline and stable Postop Assessment: no headache, no backache, spinal receding, no signs of nausea or vomiting and patient able to bend at knees Anesthetic complications: no        Last Vitals:  Vitals:   12/12/16 1230 12/12/16 1300  BP:  (!) 149/105  Pulse:  61  Resp: 16 16  Temp:  36.7 C    Last Pain:  Vitals:   12/12/16 1300  TempSrc:   PainSc: 4    Pain Goal: Patients Stated Pain Goal: 4 (12/12/16 0810)               Cecile HearingStephen Edward Kjerstin Abrigo

## 2016-12-12 NOTE — Progress Notes (Signed)
POSTPARTUM PROGRESS NOTE  Post Partum Day 1 Subjective:  Lisa Hickman is a 26 y.o. [id number] 5225w1d s/p IOL for postdates.  No acute events overnight.  Pt denies problems with ambulating, voiding or po intake.  She denies nausea or vomiting.  Pain is well controlled.  She has had flatus. She has not had bowel movement.  Lochia is appropriate  Objective: Blood pressure 121/70, pulse (!) 57, temperature 98 F (36.7 C), temperature source Oral, resp. rate 18, height 5\' 7"  (1.702 m), weight 111.1 kg (245 lb), last menstrual period 02/27/2016, unknown if currently breastfeeding.  Physical Exam:  General: alert, cooperative and no distress Lochia:normal flow Chest: CTAB, normal work of breathing Heart: RRR, normal s1 & s2, no m/r/g Abdomen: +BS, soft, tenderness to palpation in suprapubic region  Uterine Fundus: firm, DVT Evaluation: No calf swelling or tenderness Extremities:unappreciable dorsalis pedis  & posterior tibial pulses, trace bilateral edema, tender to palpation on plantar surface; 3 sec cap refill; - homan's sign bilaterally; active pressure stocking    Recent Labs  12/10/16 0831  HGB 10.3*  HCT 30.5*    Assessment/Plan:  ASSESSMENT: Lisa Hickman is a 26 y.o. [id number] 1025w1d s/p IOL 2/2postdates   Discharge home and Contraception BTL by 2pm, or as out patient; bottle feeding for now.    LOS: 2 days   Collie Siadgar [name], Medical Student MS3 Center for [facility], [facility]  12/12/2016, 7:47 AM

## 2016-12-12 NOTE — Anesthesia Preprocedure Evaluation (Addendum)
Anesthesia Evaluation  Patient identified by MRN, date of birth, ID band Patient awake    Reviewed: Allergy & Precautions, NPO status , Patient's Chart, lab work & pertinent test results  Airway Mallampati: II  TM Distance: >3 FB Neck ROM: Full    Dental  (+) Teeth Intact, Dental Advisory Given   Pulmonary asthma , former smoker,    Pulmonary exam normal breath sounds clear to auscultation       Cardiovascular Exercise Tolerance: Good negative cardio ROS Normal cardiovascular exam Rhythm:Regular Rate:Normal     Neuro/Psych negative neurological ROS  negative psych ROS   GI/Hepatic negative GI ROS, Neg liver ROS,   Endo/Other  Obesity   Renal/GU negative Renal ROS     Musculoskeletal negative musculoskeletal ROS (+)   Abdominal   Peds  Hematology  (+) Blood dyscrasia, anemia ,   Anesthesia Other Findings Day of surgery medications reviewed with the patient.  Reproductive/Obstetrics S/p SVD 12/12/15                            Anesthesia Physical Anesthesia Plan  ASA: II  Anesthesia Plan: Epidural   Post-op Pain Management:    Induction:   Airway Management Planned:   Additional Equipment:   Intra-op Plan:   Post-operative Plan:   Informed Consent: I have reviewed the patients History and Physical, chart, labs and discussed the procedure including the risks, benefits and alternatives for the proposed anesthesia with the patient or authorized representative who has indicated his/her understanding and acceptance.   Dental advisory given  Plan Discussed with:   Anesthesia Plan Comments: (Epidural in situ. Will attempt to dose to appropriate level.  Epidural infusion has been off >24 hours.  Discussed backup option of spinal anesthesia.)       Anesthesia Quick Evaluation

## 2016-12-12 NOTE — Discharge Instructions (Signed)
I have prescribed you blood pressure medication called hydrochlorothiazide that I want you to take daily.  I would also like [facility] for [facility] pressure check. A visiting nurse will also be coming by your house to check your blood pressure on 3/3 and 3/7.    If you develop chest pain, worsening swelling in your legs, hands or face, headache that does not get better with tylenol or changes in vision you need to come back to the MAU (emergency [facility])   Home Care Instructions for Mom Introduction  ACTIVITY  Gradually return to your regular activities.  Let yourself rest. Nap while your baby sleeps.  Avoid lifting anything that is heavier than 10 lb (4.5 kg) until your health care provider says it is okay.  Avoid activities that take a lot of effort and energy (are strenuous) until approved by your health care provider. Walking at a slow-to-moderate pace is usually safe.  If you had a cesarean delivery:  Do not vacuum, climb stairs, or drive a car for 4-6 weeks.  Have someone help you at home until you feel like you can do your usual activities yourself.  Do exercises as told by your health care provider, if this applies. VAGINAL BLEEDING You may continue to bleed for 4-6 weeks after delivery. Over time, the amount of blood usually decreases and the color of the blood usually gets lighter. However, the flow of bright red blood may increase if you have been too active. If you need to use more than one pad in an hour because your pad gets soaked, or if you pass a large clot:  Lie down.  Raise your feet.  Place a cold compress on your lower abdomen.  Rest.  Call your health care provider. If you are breastfeeding, your period should return anytime between 8 weeks after delivery and the time that you stop breastfeeding. If you are not breastfeeding, your period should return 6-8 weeks after  delivery. PERINEAL CARE The perineal area, or perineum, is the part of your body between your thighs. After delivery, this area needs special care. Follow these instructions as told by your health care provider.  Take warm tub baths for 15-20 minutes.  Use medicated pads and pain-relieving sprays and creams as told.  Do not use tampons or douches until vaginal bleeding has stopped.  Each time you go to the bathroom:  Use a peri bottle.  Change your pad.  Use towelettes in place of toilet paper until your stitches have healed.  Do Kegel exercises every day. Kegel exercises help to maintain the muscles that support the vagina, bladder, and bowels. You can do these exercises while you are standing, sitting, or lying down. To do Kegel exercises:  Tighten the muscles of your abdomen and the muscles that surround your birth canal.  Hold for a few seconds.  Relax.  Repeat until you have done this 5 times in a row.  To prevent hemorrhoids from developing or getting worse:  Drink enough fluid to keep your urine clear or pale yellow.  Avoid straining when having a bowel movement.  Take over-the-counter medicines and stool softeners as told by your health care provider. BREAST CARE  Wear a tight-fitting bra.  Avoid taking over-the-counter pain medicine for breast discomfort.  Apply ice to the breasts to help with discomfort as needed:  Put ice in a plastic bag.  Place  a towel between your skin and the bag.  Leave the ice on for 20 minutes or as told by your health care provider. NUTRITION  Eat a well-balanced diet.  Do not try to lose weight quickly by cutting back on calories.  Take your prenatal vitamins until your postpartum checkup or until your health care provider tells you to stop. POSTPARTUM DEPRESSION You may find yourself crying for no apparent reason and unable to cope with all of the changes that come with having a newborn. This mood is called postpartum  depression. Postpartum depression happens because your hormone levels change after delivery. If you have postpartum depression, get support from your partner, friends, and family. If the depression does not go away on its own after several weeks, contact your health care provider. BREAST SELF-EXAM Do a breast self-exam each month, at the same time of the month. If you are breastfeeding, check your breasts just after a feeding, when your breasts are less full. If you are breastfeeding and your period has started, check your breasts on day 5, 6, or 7 of your period. Report any lumps, bumps, or discharge to your health care provider. Know that breasts are normally lumpy if you are breastfeeding. This is temporary, and it is not a health risk. INTIMACY AND SEXUALITY Avoid sexual activity for at least 3-4 weeks after delivery or until the brownish-red vaginal flow is completely gone. If you want to avoid pregnancy, use some form of birth control. You can get pregnant after delivery, even if you have not had your period. SEEK MEDICAL CARE IF:  You feel unable to cope with the changes that a child brings to your life, and these feelings do not go away after several weeks.  You notice a lump, a bump, or discharge on your breast. SEEK IMMEDIATE MEDICAL CARE IF:  Blood soaks your pad in 1 hour or less.  You have:  Severe pain or cramping in your lower abdomen.  A bad-smelling vaginal discharge.  A fever that is not controlled by medicine.  A fever, and an area of your breast is red and sore.  Pain or redness in your calf.  Sudden, severe chest pain.  Shortness of breath.  Painful or bloody urination.  Problems with your vision.  You vomit for 12 hours or longer.  You develop a severe headache.  You have serious thoughts about hurting yourself, your child, or anyone else. This information is not intended to replace advice given to you by your health care provider. Make sure you discuss  any questions you have with your health care provider. Document Released: 10/26/2000 Document Revised: 04/05/2016 Document Reviewed: 05/02/2015  2017 Elsevier

## 2016-12-13 ENCOUNTER — Encounter (HOSPITAL_COMMUNITY): Payer: Self-pay | Admitting: Family Medicine

## 2016-12-13 NOTE — Clinical Social Work Maternal (Signed)
CLINICAL SOCIAL WORK MATERNAL/CHILD NOTE  Patient Details  Name: Lisa Hickman MRN: [id number] Date of Birth: 12/11/2014  Date:  12/13/2016  Clinical Social Worker Initiating Note:  Laurey Arrow Date/ Time Initiated:  12/12/16/1300     Child's Name:  Lisa Hickman   Legal Guardian:  Mother   Need for Interpreter:  None   Date of Referral:  12/11/16     Reason for Referral:  Current Substance Use/Substance Use During Pregnancy  (hx of [facility])   Referral Source:  Central Nursery   Address:  [address] [place], MOB stated MOB will be relocating to [address], [place] [address]. Phone number:  [phone]   Household Members:  Self, Parents   Natural Supports (not living in the home):  Immediate Family, Friends   Chiropodist: None   Employment:     Type of Work: Insurance underwriter Resources:  [employer]   Other Resources:  Physicist, medical  (CSW provided MOB with information to apply for [facility]   Cultural/Religious Considerations Which May Impact Care:  Per MOB, MOB is [employer]  Strengths:  Ability to meet basic needs , Home prepared for child    Risk Factors/Current Problems:  Substance Use    Cognitive State:  Able to Concentrate , Linear Thinking , Insightful    Mood/Affect:  Interested , Happy , Bright , Comfortable    CSW Assessment: CSW met with MOB to complete an assessment for hx THC.  MOB was receptive to meeting with CSW.  When CSW arrived, MOB was eating lunch in bed and infant was being held by MOB's friend, Genesis Potts.  MOB gave CSW permission to meet with MOB while MOB's friend was present.  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 MOB communicated that MOB experienced three losses due to unexpected death and MOB utilized marijuana to help MOB cope. CSW thanked MOB for being honest and expressed concerns  about MOB's coping mechanisms.  CSW offered MOB outpatient resources for counseling for grief and loss and MOB declined.  MOB reported since giving birth, MOB is aware that MOB needs to make healthier decisions and MOB stated that MOB has decided to no longer smoke marijuana.  CSW praised MOB for her decision. CSW informed MOB of the hospital's drug screen policy, and informed MOB of the 2 screenings for the infant. CSW made MOB aware that the infant's UDS was negative and CSW will continue to monitor the infant's CDS.  MOB appeared understanding and communicated that the infant's CDS will probably will be positive. CSW explained to  MOB if the infant's CDS is positive without an explanation, CSW will make a report to [facility] CPS.  MOB did not have any questions regarding the hospital's policy. CSW offered MOB resources and referrals for SA, and MOB declined.  CSW educated MOB about PPD and expressed concerns again about MOB's MH regarding MOB's experiencing three recent losses.  CSW informed MOB of possible supports and interventions to decrease PPD.  CSW also encouraged MOB to seek medical attention if needed for increased signs and symptoms for PPD. CSW reviewed safe sleep, and SIDS. MOB was knowledgeable and asked appropriate questions.  MOB communicated that she has a pack n play for the baby, and feels prepared for the infant.  MOB did not have any further questions, concerns, or needs at this time.  CSW thanked MOB for meeting with CSW and provided MOB with CSW contact information.  CSW Plan/Description:  Engineer, mining , Information/Referral to [employer] , No Further Intervention Required/No Barriers to Discharge (CSW will monitor infant's cord and will make a report if warranted. )   Laurey Arrow, MSW, LCSW Clinical Social Work [phone]

## 2016-12-17 ENCOUNTER — Encounter (HOSPITAL_COMMUNITY): Payer: Self-pay

## 2016-12-17 NOTE — Progress Notes (Signed)
CSW made CPS report with CPS worker, Bernie CoveyPam Miller.  Infant's CDS was positive for THC.  CPS will follow-up with family within 72 hours.   Blaine HamperAngel Boyd-Gilyard, MSW, LCSW Clinical Social Work [phone]

## 2017-01-15 NOTE — Progress Notes (Signed)
NST performed today was reviewed and was found to be reactive.  Continue recommended antenatal testing and prenatal care.  

## 2017-01-21 ENCOUNTER — Ambulatory Visit: Payer: Medicaid Other | Admitting: Obstetrics and Gynecology

## 2017-01-22 ENCOUNTER — Ambulatory Visit: Payer: Medicaid Other | Admitting: Certified Nurse Midwife

## 2017-01-31 ENCOUNTER — Encounter: Payer: Self-pay | Admitting: Obstetrics

## 2017-01-31 ENCOUNTER — Encounter: Payer: Self-pay | Admitting: *Deleted

## 2017-01-31 ENCOUNTER — Ambulatory Visit (INDEPENDENT_AMBULATORY_CARE_PROVIDER_SITE_OTHER): Payer: Medicaid Other | Admitting: Obstetrics

## 2017-01-31 NOTE — Progress Notes (Signed)
Post Partum Exam  Lisa Hickman is a 26 y.o. [phone] female who presents for a postpartum visit. She is 6 weeks postpartum following a spontaneous vaginal delivery. I have fully reviewed the prenatal and intrapartum course. The delivery was at 41 gestational weeks.  Anesthesia: epidural. Postpartum course has been doing well. Baby's course has been doing well. Baby is feeding by bottle - Similac Advance. Bleeding no bleedingBowel function is normal. Bladder function is normal. Patient is sexually active. Contraception method is tubal ligation.  Postpartum depression screening:neg, score 0.  The following portions of the patient's history were reviewed and updated as appropriate: allergies, current medications, past family history, past medical history, past social history, past surgical history and problem list.  Review of Systems A comprehensive review of systems was negative.    Objective:  unknown if currently breastfeeding.  General:  alert and no distress   Breasts:  inspection negative, no nipple discharge or bleeding, no masses or nodularity palpable  Lungs: clear to auscultation bilaterally  Heart:  regular rate and rhythm, S1, S2 normal, no murmur, click, rub or gallop  Abdomen: soft, non-tender; bowel sounds normal; no masses,  no organomegaly   Vulva:  normal  Vagina: normal vagina  Cervix:  no cervical motion tenderness  Corpus: normal size, contour, position, consistency, mobility, non-tender  Adnexa:  no mass, fullness, tenderness  Rectal Exam: Not performed.        Assessment:    Normal postpartum exam. Pap smear not done at today's visit.   Contraceptive Counseling and Advice.  Doing well after PPTL.  Plan:   1. Contraception: tubal ligation.  Done. 2. Continue PNV's 3. Follow up in: 6 months or as needed.   Annual and Pap

## 2017-03-28 ENCOUNTER — Inpatient Hospital Stay (HOSPITAL_COMMUNITY)
Admission: AD | Admit: 2017-03-28 | Discharge: 2017-03-28 | Discharge status: Against Medical Advice | Payer: Medicaid Other | Source: Ambulatory Visit | Attending: Obstetrics & Gynecology | Admitting: Obstetrics & Gynecology

## 2017-03-28 ENCOUNTER — Encounter (HOSPITAL_COMMUNITY): Payer: Self-pay | Admitting: Family Medicine

## 2017-03-28 ENCOUNTER — Ambulatory Visit (HOSPITAL_COMMUNITY)
Admission: EM | Admit: 2017-03-28 | Discharge: 2017-03-28 | Disposition: A | Discharge status: Home / Self Care | Payer: Medicaid Other | Attending: Family Medicine | Admitting: Family Medicine

## 2017-03-28 DIAGNOSIS — N920 Excessive and frequent menstruation with regular cycle: Secondary | ICD-10-CM | POA: Insufficient documentation

## 2017-03-28 DIAGNOSIS — Z87891 Personal history of nicotine dependence: Secondary | ICD-10-CM | POA: Insufficient documentation

## 2017-03-28 DIAGNOSIS — Z9851 Tubal ligation status: Secondary | ICD-10-CM | POA: Insufficient documentation

## 2017-03-28 DIAGNOSIS — Z8249 Family history of ischemic heart disease and other diseases of the circulatory system: Secondary | ICD-10-CM | POA: Insufficient documentation

## 2017-03-28 DIAGNOSIS — N390 Urinary tract infection, site not specified: Secondary | ICD-10-CM | POA: Diagnosis not present

## 2017-03-28 DIAGNOSIS — R102 Pelvic and perineal pain: Secondary | ICD-10-CM | POA: Diagnosis present

## 2017-03-28 DIAGNOSIS — R8281 Pyuria: Secondary | ICD-10-CM

## 2017-03-28 LAB — POCT URINALYSIS DIP (DEVICE)
Glucose, UA: NEGATIVE mg/dL
Ketones, ur: 15 mg/dL — AB
Nitrite: NEGATIVE
Protein, ur: 30 mg/dL — AB
Specific Gravity, Urine: 1.025 (ref 1.005–1.030)
Urobilinogen, UA: 1 mg/dL (ref 0.0–1.0)
pH: 5.5 (ref 5.0–8.0)

## 2017-03-28 MED ORDER — NAPROXEN 500 MG PO TABS: 500.0000 mg | ORAL_TABLET | Freq: Two times a day (BID) | ORAL | 0 refills | Status: DC

## 2017-03-28 MED ORDER — CIPROFLOXACIN HCL 500 MG PO TABS: 500.0000 mg | ORAL_TABLET | Freq: Two times a day (BID) | ORAL | 0 refills | Status: DC

## 2017-03-28 MED ORDER — AZITHROMYCIN 250 MG PO TABS
ORAL_TABLET | ORAL | Status: AC
  Filled 2017-03-28: qty 4

## 2017-03-28 MED ORDER — AZITHROMYCIN 250 MG PO TABS
1000.0000 mg | ORAL_TABLET | Freq: Once | ORAL | Status: AC
  Administered 2017-03-28: 1000 mg via ORAL

## 2017-03-28 NOTE — MAU Note (Signed)
Not in lobby x1  

## 2017-03-28 NOTE — MAU Note (Signed)
Not in lobby x2.
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
## 2017-03-28 NOTE — ED Triage Notes (Signed)
Pt has been having mid back pain and mid abdominal pain since she had a tubal ligation done three months ago.  She states the pain has been intermittent.  She was seen by her ob/gyn at her 6 week appointment but was not having as much pain then.  [facility] but the wait was 2.5 hours.

## 2017-03-28 NOTE — Discharge Instructions (Signed)
Please contact your gynecological surgeon (who performed the tubal ligation last winter) to follow up on the continued gynecological symptoms of pelvic pain and heavy menstrual flow.

## 2017-03-28 NOTE — MAU Note (Signed)
Not in lobby x 3. Dismiss patient as AMA

## 2017-03-29 LAB — URINE CYTOLOGY ANCILLARY ONLY
Chlamydia: NEGATIVE
Neisseria Gonorrhea: NEGATIVE
Trichomonas: POSITIVE — AB

## 2017-03-30 LAB — URINE CULTURE

## 2017-04-01 ENCOUNTER — Telehealth (HOSPITAL_COMMUNITY): Payer: Self-pay | Admitting: Internal Medicine

## 2017-04-01 LAB — URINE CYTOLOGY ANCILLARY ONLY: Candida vaginitis: NEGATIVE

## 2017-04-01 MED ORDER — METRONIDAZOLE 500 MG PO TABS: 2000.0000 mg | ORAL_TABLET | Freq: Once | ORAL | 0 refills | Status: AC

## 2017-04-01 NOTE — Telephone Encounter (Signed)
Clinical staff, please let patient know that urine culture does not clearly demonstrate a UTI.  Other possible causes of urinary discomfort include chafing; irritation from hygiene product; other pelvic infection (yeast, BV) or STD; occasional dietary cause (caffeine); bowel issue; low estrogen effect; kidney stone passage; or interstitial cystitis.  Additionally there are many other causes of pelvic pain. Test for trichomonas was positive.  This sometimes causes symptoms such as vaginal irritation/discharge/itching.  Will send rx for metronidazole to pharmacy of record, [facility] on [address].  Recheck or followup with [facility] [phone] or your gynecologist for further evaluation if symptoms are not improving.  LM

## 2017-09-13 ENCOUNTER — Encounter (HOSPITAL_COMMUNITY): Payer: Self-pay | Admitting: Emergency Medicine

## 2017-09-13 ENCOUNTER — Emergency Department (HOSPITAL_COMMUNITY)
Admission: EM | Admit: 2017-09-13 | Discharge: 2017-09-13 | Disposition: A | Discharge status: Home / Self Care | Payer: Medicaid Other | Attending: Emergency Medicine | Admitting: Emergency Medicine

## 2017-09-13 ENCOUNTER — Emergency Department (HOSPITAL_COMMUNITY): Payer: Medicaid Other

## 2017-09-13 DIAGNOSIS — Y999 Unspecified external cause status: Secondary | ICD-10-CM | POA: Insufficient documentation

## 2017-09-13 DIAGNOSIS — W2209XA Striking against other stationary object, initial encounter: Secondary | ICD-10-CM | POA: Diagnosis not present

## 2017-09-13 DIAGNOSIS — Z87891 Personal history of nicotine dependence: Secondary | ICD-10-CM | POA: Insufficient documentation

## 2017-09-13 DIAGNOSIS — R519 Headache, unspecified: Secondary | ICD-10-CM

## 2017-09-13 DIAGNOSIS — S0990XA Unspecified injury of head, initial encounter: Secondary | ICD-10-CM | POA: Insufficient documentation

## 2017-09-13 DIAGNOSIS — J45909 Unspecified asthma, uncomplicated: Secondary | ICD-10-CM | POA: Diagnosis not present

## 2017-09-13 DIAGNOSIS — R51 Headache: Secondary | ICD-10-CM

## 2017-09-13 DIAGNOSIS — Y929 Unspecified place or not applicable: Secondary | ICD-10-CM | POA: Insufficient documentation

## 2017-09-13 DIAGNOSIS — Y939 Activity, unspecified: Secondary | ICD-10-CM | POA: Insufficient documentation

## 2017-09-13 MED ORDER — ACETAMINOPHEN 325 MG PO TABS
650.0000 mg | ORAL_TABLET | Freq: Once | ORAL | Status: AC
  Administered 2017-09-13: 650 mg via ORAL
  Filled 2017-09-13: qty 2

## 2017-09-13 MED ORDER — NAPROXEN 500 MG PO TABS: 500.0000 mg | ORAL_TABLET | Freq: Two times a day (BID) | ORAL | 0 refills | Status: DC

## 2017-09-13 MED ORDER — CIPROFLOXACIN HCL 500 MG PO TABS: 500.0000 mg | ORAL_TABLET | Freq: Two times a day (BID) | ORAL | 0 refills | Status: DC
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
## 2017-09-13 NOTE — Discharge Instructions (Signed)
Please read and follow all provided instructions.  Your diagnoses today include:  1. Injury of head, initial encounter   2. Acute nonintractable headache, unspecified headache type     Tests performed today include:  CT scan of your head that did not show any serious injury.  Vital signs. See below for your results today.   Medications prescribed:   Naproxen - anti-inflammatory pain medication  Do not exceed 500mg  naproxen every 12 hours, take with food  You have been prescribed an anti-inflammatory medication or NSAID. Take with food. Take smallest effective dose for the shortest duration needed for your pain. Stop taking if you experience stomach pain or vomiting.   Take any prescribed medications only as directed.  Home care instructions:  Follow any educational materials contained in this packet.  BE VERY CAREFUL not to take multiple medicines containing Tylenol (also called acetaminophen). Doing so can lead to an overdose which can damage your liver and cause liver failure and possibly death.   Follow-up instructions: Please follow-up with your primary care provider in the next 3 days for further evaluation of your symptoms.   Return instructions:  SEEK IMMEDIATE MEDICAL ATTENTION IF:  There is confusion or drowsiness (although children frequently become drowsy after injury).   You cannot awaken the injured person.   You have more than one episode of vomiting.   You notice dizziness or unsteadiness which is getting worse, or inability to walk.   You have convulsions or unconsciousness.   You experience severe, persistent headaches not relieved by Tylenol.  You cannot use arms or legs normally.   There are changes in pupil sizes. (This is the black center in the colored part of the eye)   There is clear or bloody discharge from the nose or ears.   You have change in speech, vision, swallowing, or understanding.   Localized weakness, numbness, tingling, or  change in bowel or bladder control.  You have any other emergent concerns.  Additional Information: You have had a head injury which does not appear to require admission at this time.  Your vital signs today were: BP 126/79 (BP Location: Left Arm)    Pulse 90    Temp 98.2 F (36.8 C) (Oral)    Resp 18    Ht 5\' 7"  (1.702 m)    Wt 90.7 kg (200 lb)    LMP 09/11/2017    SpO2 100%    BMI 31.32 kg/m  If your blood pressure (BP) was elevated above 135/85 this visit, please have this repeated by your doctor within one month. --------------

## 2017-09-13 NOTE — ED Notes (Signed)
Patient transported to CT 

## 2017-10-03 ENCOUNTER — Encounter: Payer: Self-pay | Admitting: Emergency Medicine

## 2017-10-03 ENCOUNTER — Emergency Department
Admission: EM | Admit: 2017-10-03 | Discharge: 2017-10-03 | Disposition: A | Discharge status: Home / Self Care | Payer: Medicaid Other | Attending: Emergency Medicine | Admitting: Emergency Medicine

## 2017-10-03 DIAGNOSIS — J45909 Unspecified asthma, uncomplicated: Secondary | ICD-10-CM | POA: Diagnosis not present

## 2017-10-03 DIAGNOSIS — Y9289 Other specified places as the place of occurrence of the external cause: Secondary | ICD-10-CM | POA: Diagnosis not present

## 2017-10-03 DIAGNOSIS — Z87891 Personal history of nicotine dependence: Secondary | ICD-10-CM | POA: Insufficient documentation

## 2017-10-03 DIAGNOSIS — W25XXXA Contact with sharp glass, initial encounter: Secondary | ICD-10-CM | POA: Insufficient documentation

## 2017-10-03 DIAGNOSIS — R51 Headache: Secondary | ICD-10-CM

## 2017-10-03 DIAGNOSIS — Y939 Activity, unspecified: Secondary | ICD-10-CM | POA: Diagnosis not present

## 2017-10-03 DIAGNOSIS — S0181XA Laceration without foreign body of other part of head, initial encounter: Secondary | ICD-10-CM

## 2017-10-03 DIAGNOSIS — Z79899 Other long term (current) drug therapy: Secondary | ICD-10-CM | POA: Diagnosis not present

## 2017-10-03 DIAGNOSIS — S0990XA Unspecified injury of head, initial encounter: Secondary | ICD-10-CM | POA: Diagnosis present

## 2017-10-03 DIAGNOSIS — R519 Headache, unspecified: Secondary | ICD-10-CM

## 2017-10-03 DIAGNOSIS — Y999 Unspecified external cause status: Secondary | ICD-10-CM | POA: Insufficient documentation

## 2017-10-03 MED ORDER — PROMETHAZINE HCL 25 MG PO TABS
25.0000 mg | ORAL_TABLET | Freq: Once | ORAL | Status: AC
  Administered 2017-10-03: 25 mg via ORAL
  Filled 2017-10-03: qty 1

## 2017-10-03 MED ORDER — LIDOCAINE HCL (PF) 1 % IJ SOLN
5.0000 mL | Freq: Once | INTRAMUSCULAR | Status: AC
  Administered 2017-10-03: 5 mL via INTRADERMAL
  Filled 2017-10-03: qty 5

## 2017-10-03 MED ORDER — CEPHALEXIN 500 MG PO CAPS: 500.0000 mg | ORAL_CAPSULE | Freq: Four times a day (QID) | ORAL | 0 refills | Status: AC

## 2017-10-03 MED ORDER — KETOROLAC TROMETHAMINE 30 MG/ML IJ SOLN
30.0000 mg | Freq: Once | INTRAMUSCULAR | Status: AC
  Administered 2017-10-03: 30 mg via INTRAMUSCULAR
  Filled 2017-10-03: qty 1
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
## 2017-11-24 ENCOUNTER — Emergency Department (HOSPITAL_COMMUNITY): Payer: Medicaid Other

## 2017-11-24 ENCOUNTER — Other Ambulatory Visit: Payer: Self-pay

## 2017-11-24 ENCOUNTER — Encounter (HOSPITAL_COMMUNITY): Payer: Self-pay | Admitting: *Deleted

## 2017-11-24 ENCOUNTER — Emergency Department (HOSPITAL_COMMUNITY)
Admission: EM | Admit: 2017-11-24 | Discharge: 2017-11-24 | Disposition: A | Discharge status: Home / Self Care | Payer: Medicaid Other | Attending: Emergency Medicine | Admitting: Emergency Medicine

## 2017-11-24 DIAGNOSIS — Z79899 Other long term (current) drug therapy: Secondary | ICD-10-CM | POA: Insufficient documentation

## 2017-11-24 DIAGNOSIS — Z87891 Personal history of nicotine dependence: Secondary | ICD-10-CM | POA: Diagnosis not present

## 2017-11-24 DIAGNOSIS — J45909 Unspecified asthma, uncomplicated: Secondary | ICD-10-CM | POA: Insufficient documentation

## 2017-11-24 DIAGNOSIS — G43909 Migraine, unspecified, not intractable, without status migrainosus: Secondary | ICD-10-CM | POA: Insufficient documentation

## 2017-11-24 DIAGNOSIS — R51 Headache: Secondary | ICD-10-CM | POA: Diagnosis present

## 2017-11-24 DIAGNOSIS — J302 Other seasonal allergic rhinitis: Secondary | ICD-10-CM | POA: Insufficient documentation

## 2017-11-24 DIAGNOSIS — G43809 Other migraine, not intractable, without status migrainosus: Secondary | ICD-10-CM

## 2017-11-24 MED ORDER — METOCLOPRAMIDE HCL 5 MG/ML IJ SOLN
10.0000 mg | Freq: Once | INTRAMUSCULAR | Status: DC
  Filled 2017-11-24: qty 2

## 2017-11-24 MED ORDER — DIPHENHYDRAMINE HCL 50 MG/ML IJ SOLN
25.0000 mg | Freq: Once | INTRAMUSCULAR | Status: DC
  Filled 2017-11-24: qty 1

## 2017-11-24 MED ORDER — KETOROLAC TROMETHAMINE 60 MG/2ML IM SOLN
60.0000 mg | Freq: Once | INTRAMUSCULAR | Status: AC
  Administered 2017-11-24: 60 mg via INTRAMUSCULAR
  Filled 2017-11-24: qty 2

## 2017-11-24 MED ORDER — BUTALBITAL-APAP-CAFFEINE 50-325-40 MG PO TABS: 1.0000 | ORAL_TABLET | Freq: Four times a day (QID) | ORAL | 0 refills | Status: DC | PRN

## 2017-11-24 NOTE — ED Triage Notes (Addendum)
Pt states has has a headache for 3 weeks and for the last 2 weeks of flashing light in corner of left eye. She took Excedrin prior to coming in without relief
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
## 2017-11-24 NOTE — ED Notes (Signed)
Patient transported to CT 

## 2017-12-09 ENCOUNTER — Encounter (HOSPITAL_COMMUNITY): Payer: Self-pay | Admitting: Emergency Medicine

## 2017-12-09 ENCOUNTER — Emergency Department (HOSPITAL_COMMUNITY)
Admission: EM | Admit: 2017-12-09 | Discharge: 2017-12-09 | Disposition: A | Discharge status: Home / Self Care | Payer: Medicaid Other | Attending: Emergency Medicine | Admitting: Emergency Medicine

## 2017-12-09 DIAGNOSIS — N939 Abnormal uterine and vaginal bleeding, unspecified: Secondary | ICD-10-CM | POA: Diagnosis not present

## 2017-12-09 DIAGNOSIS — M79644 Pain in right finger(s): Secondary | ICD-10-CM | POA: Insufficient documentation

## 2017-12-09 DIAGNOSIS — Z5321 Procedure and treatment not carried out due to patient leaving prior to being seen by health care provider: Secondary | ICD-10-CM | POA: Insufficient documentation

## 2017-12-09 LAB — BASIC METABOLIC PANEL
Anion gap: 7 (ref 5–15)
BUN: 6 mg/dL (ref 6–20)
CO2: 23 mmol/L (ref 22–32)
Calcium: 8.4 mg/dL — ABNORMAL LOW (ref 8.9–10.3)
Chloride: 107 mmol/L (ref 101–111)
Creatinine, Ser: 0.84 mg/dL (ref 0.44–1.00)
GFR calc Af Amer: >60 mL/min (ref >60)
GFR calc non Af Amer: >60 mL/min (ref >60)
Glucose, Bld: 92 mg/dL (ref 65–99)
Potassium: 4.2 mmol/L (ref 3.5–5.1)
Sodium: 137 mmol/L (ref 135–145)

## 2017-12-09 LAB — CBC
HCT: 39.1 % (ref 36.0–46.0)
Hemoglobin: 12.6 g/dL (ref 12.0–15.0)
MCH: 28.8 pg (ref 26.0–34.0)
MCHC: 32.2 g/dL (ref 30.0–36.0)
MCV: 89.5 fL (ref 78.0–100.0)
Platelets: 323 10*3/uL (ref 150–400)
RBC: 4.37 MIL/uL (ref 3.87–5.11)
RDW: 12.9 % (ref 11.5–15.5)
WBC: 10.2 10*3/uL (ref 4.0–10.5)

## 2017-12-09 LAB — I-STAT BETA HCG BLOOD, ED (MC, WL, AP ONLY): I-stat hCG, quantitative: <5 m[IU]/mL
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
## 2017-12-09 NOTE — ED Notes (Signed)
No answer when called for VS 

## 2017-12-09 NOTE — ED Notes (Signed)
Still no answer when called X 2.

## 2017-12-13 NOTE — ED Notes (Signed)
12/13/2017  Called listed phone number for follow-up call. No answer, unable to leave a message

## 2017-12-16 ENCOUNTER — Emergency Department: Payer: Medicaid Other

## 2017-12-16 ENCOUNTER — Emergency Department
Admission: EM | Admit: 2017-12-16 | Discharge: 2017-12-16 | Disposition: A | Discharge status: Home / Self Care | Payer: Medicaid Other | Attending: Emergency Medicine | Admitting: Emergency Medicine

## 2017-12-16 ENCOUNTER — Encounter: Payer: Self-pay | Admitting: Intensive Care

## 2017-12-16 DIAGNOSIS — M79641 Pain in right hand: Secondary | ICD-10-CM | POA: Diagnosis not present

## 2017-12-16 DIAGNOSIS — R51 Headache: Secondary | ICD-10-CM | POA: Diagnosis not present

## 2017-12-16 DIAGNOSIS — J45909 Unspecified asthma, uncomplicated: Secondary | ICD-10-CM | POA: Diagnosis not present

## 2017-12-16 DIAGNOSIS — Z87891 Personal history of nicotine dependence: Secondary | ICD-10-CM | POA: Diagnosis not present

## 2017-12-16 MED ORDER — ONDANSETRON 4 MG PO TBDP: 4.0000 mg | ORAL_TABLET | Freq: Three times a day (TID) | ORAL | 0 refills | Status: DC | PRN

## 2017-12-16 MED ORDER — ONDANSETRON 4 MG PO TBDP
4.0000 mg | ORAL_TABLET | Freq: Once | ORAL | Status: AC
  Administered 2017-12-16: 4 mg via ORAL

## 2017-12-16 MED ORDER — ONDANSETRON 4 MG PO TBDP
ORAL_TABLET | ORAL | Status: AC
  Administered 2017-12-16: 4 mg via ORAL
  Filled 2017-12-16: qty 1

## 2017-12-16 MED ORDER — IBUPROFEN 800 MG PO TABS
ORAL_TABLET | ORAL | Status: AC
  Administered 2017-12-16: 800 mg via ORAL
  Filled 2017-12-16: qty 1

## 2017-12-16 MED ORDER — OXYCODONE-ACETAMINOPHEN 5-325 MG PO TABS
ORAL_TABLET | ORAL | Status: AC
  Administered 2017-12-16: 2 via ORAL
  Filled 2017-12-16: qty 2

## 2017-12-16 MED ORDER — IBUPROFEN 800 MG PO TABS
800.0000 mg | ORAL_TABLET | Freq: Once | ORAL | Status: AC
  Administered 2017-12-16: 800 mg via ORAL

## 2017-12-16 MED ORDER — OXYCODONE-ACETAMINOPHEN 5-325 MG PO TABS: 1.0000 | ORAL_TABLET | Freq: Four times a day (QID) | ORAL | 0 refills | Status: DC | PRN

## 2017-12-16 MED ORDER — OXYCODONE-ACETAMINOPHEN 5-325 MG PO TABS
2.0000 | ORAL_TABLET | Freq: Once | ORAL | Status: AC
  Administered 2017-12-16: 2 via ORAL
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
## 2017-12-16 NOTE — Discharge Instructions (Signed)
As we discussed please take your pain medication as needed, but only as written.  Please also use ibuprofen products as written on the box, as needed.  Please wear your wrist splint as needed for comfort, please discontinue use within 5 days to preserve range of motion.  Return to the emergency department for any personally concerning symptoms.

## 2017-12-16 NOTE — ED Notes (Signed)
[place] [employer] at bedside 
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
## 2018-02-09 ENCOUNTER — Emergency Department (HOSPITAL_COMMUNITY)
Admission: EM | Admit: 2018-02-09 | Discharge: 2018-02-09 | Discharge status: Against Medical Advice | Payer: Medicaid Other
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
## 2018-07-24 ENCOUNTER — Ambulatory Visit (HOSPITAL_COMMUNITY)
Admission: RE | Admit: 2018-07-24 | Discharge: 2018-07-24 | Disposition: A | Discharge status: Home / Self Care | Payer: Medicaid Other | Source: Ambulatory Visit | Attending: Family Medicine | Admitting: Family Medicine

## 2018-07-24 ENCOUNTER — Ambulatory Visit (HOSPITAL_COMMUNITY): Payer: Medicaid Other

## 2018-07-24 ENCOUNTER — Ambulatory Visit (INDEPENDENT_AMBULATORY_CARE_PROVIDER_SITE_OTHER): Payer: Medicaid Other

## 2018-07-24 ENCOUNTER — Encounter (HOSPITAL_COMMUNITY): Payer: Self-pay | Admitting: Emergency Medicine

## 2018-07-24 ENCOUNTER — Other Ambulatory Visit: Payer: Self-pay

## 2018-07-24 ENCOUNTER — Ambulatory Visit (HOSPITAL_COMMUNITY): Admission: RE | Admit: 2018-07-24 | Payer: Medicaid Other | Source: Ambulatory Visit

## 2018-07-24 ENCOUNTER — Ambulatory Visit (HOSPITAL_COMMUNITY)
Admission: EM | Admit: 2018-07-24 | Discharge: 2018-07-24 | Disposition: A | Discharge status: Home / Self Care | Payer: Medicaid Other | Attending: Family Medicine | Admitting: Family Medicine

## 2018-07-24 DIAGNOSIS — R52 Pain, unspecified: Secondary | ICD-10-CM

## 2018-07-24 DIAGNOSIS — T07XXXA Unspecified multiple injuries, initial encounter: Secondary | ICD-10-CM

## 2018-07-24 DIAGNOSIS — M5489 Other dorsalgia: Secondary | ICD-10-CM

## 2018-07-24 DIAGNOSIS — M542 Cervicalgia: Secondary | ICD-10-CM | POA: Diagnosis not present

## 2018-07-24 DIAGNOSIS — S0083XA Contusion of other part of head, initial encounter: Secondary | ICD-10-CM

## 2018-07-24 DIAGNOSIS — Z7982 Long term (current) use of aspirin: Secondary | ICD-10-CM | POA: Insufficient documentation

## 2018-07-24 DIAGNOSIS — R0781 Pleurodynia: Secondary | ICD-10-CM

## 2018-07-24 DIAGNOSIS — S0512XA Contusion of eyeball and orbital tissues, left eye, initial encounter: Secondary | ICD-10-CM | POA: Diagnosis not present

## 2018-07-24 DIAGNOSIS — S40012A Contusion of left shoulder, initial encounter: Secondary | ICD-10-CM

## 2018-07-24 DIAGNOSIS — S022XXA Fracture of nasal bones, initial encounter for closed fracture: Secondary | ICD-10-CM | POA: Insufficient documentation

## 2018-07-24 DIAGNOSIS — X58XXXA Exposure to other specified factors, initial encounter: Secondary | ICD-10-CM | POA: Diagnosis not present

## 2018-07-24 DIAGNOSIS — M7911 Myalgia of mastication muscle: Secondary | ICD-10-CM | POA: Diagnosis not present

## 2018-07-24 DIAGNOSIS — Z87891 Personal history of nicotine dependence: Secondary | ICD-10-CM | POA: Diagnosis not present

## 2018-07-24 DIAGNOSIS — S40011A Contusion of right shoulder, initial encounter: Secondary | ICD-10-CM

## 2018-07-24 MED ORDER — IBUPROFEN 800 MG PO TABS: 800.0000 mg | ORAL_TABLET | Freq: Three times a day (TID) | ORAL | 0 refills | Status: DC

## 2018-07-24 MED ORDER — KETOROLAC TROMETHAMINE 60 MG/2ML IM SOLN
60.0000 mg | Freq: Once | INTRAMUSCULAR | Status: AC
  Administered 2018-07-24: 60 mg via INTRAMUSCULAR

## 2018-07-24 MED ORDER — OXYCODONE-ACETAMINOPHEN 5-325 MG PO TABS: 1.0000 | ORAL_TABLET | ORAL | 0 refills | Status: DC | PRN

## 2018-07-24 MED ORDER — KETOROLAC TROMETHAMINE 60 MG/2ML IM SOLN
INTRAMUSCULAR | Status: AC
  Filled 2018-07-24: qty 2
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
## 2018-07-24 NOTE — Discharge Instructions (Addendum)
Get plenty of rest Use ice to painful areas Take ibuprofen 3 times a day with food Take the stronger pain medication, oxycodone, when needed  Go to the ER now to get the x-ray of your jaw Make sure we have a phone number to reach you in case anything is fractured

## 2018-09-29 ENCOUNTER — Emergency Department (HOSPITAL_COMMUNITY)
Admission: EM | Admit: 2018-09-29 | Discharge: 2018-09-29 | Disposition: A | Discharge status: Home / Self Care | Payer: No Typology Code available for payment source | Attending: Emergency Medicine | Admitting: Emergency Medicine

## 2018-09-29 ENCOUNTER — Emergency Department (HOSPITAL_COMMUNITY): Payer: No Typology Code available for payment source

## 2018-09-29 ENCOUNTER — Other Ambulatory Visit: Payer: Self-pay

## 2018-09-29 DIAGNOSIS — Y9389 Activity, other specified: Secondary | ICD-10-CM | POA: Insufficient documentation

## 2018-09-29 DIAGNOSIS — Z87891 Personal history of nicotine dependence: Secondary | ICD-10-CM | POA: Diagnosis not present

## 2018-09-29 DIAGNOSIS — Y998 Other external cause status: Secondary | ICD-10-CM | POA: Diagnosis not present

## 2018-09-29 DIAGNOSIS — F1210 Cannabis abuse, uncomplicated: Secondary | ICD-10-CM | POA: Insufficient documentation

## 2018-09-29 DIAGNOSIS — J45909 Unspecified asthma, uncomplicated: Secondary | ICD-10-CM | POA: Insufficient documentation

## 2018-09-29 DIAGNOSIS — S0990XA Unspecified injury of head, initial encounter: Secondary | ICD-10-CM | POA: Insufficient documentation

## 2018-09-29 DIAGNOSIS — Y92410 Unspecified street and highway as the place of occurrence of the external cause: Secondary | ICD-10-CM | POA: Diagnosis not present

## 2018-09-29 MED ORDER — KETOROLAC TROMETHAMINE 30 MG/ML IJ SOLN
30.0000 mg | Freq: Once | INTRAMUSCULAR | Status: AC
  Administered 2018-09-29: 30 mg via INTRAMUSCULAR
  Filled 2018-09-29: qty 1
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
## 2018-09-29 NOTE — Discharge Instructions (Addendum)
Make sure you are getting plenty of rest and drinking a lot of fluids.  For pain use Tylenol or Motrin.  Follow-up with the doctor of your choice as needed for problems.

## 2018-10-26 ENCOUNTER — Emergency Department (HOSPITAL_COMMUNITY): Payer: Medicaid Other

## 2018-10-26 ENCOUNTER — Encounter (HOSPITAL_COMMUNITY): Payer: Self-pay

## 2018-10-26 ENCOUNTER — Emergency Department (HOSPITAL_COMMUNITY)
Admission: EM | Admit: 2018-10-26 | Discharge: 2018-10-26 | Disposition: A | Discharge status: Home / Self Care | Payer: Medicaid Other | Attending: Emergency Medicine | Admitting: Emergency Medicine

## 2018-10-26 DIAGNOSIS — Z7982 Long term (current) use of aspirin: Secondary | ICD-10-CM | POA: Diagnosis not present

## 2018-10-26 DIAGNOSIS — Y929 Unspecified place or not applicable: Secondary | ICD-10-CM | POA: Insufficient documentation

## 2018-10-26 DIAGNOSIS — S0990XA Unspecified injury of head, initial encounter: Secondary | ICD-10-CM | POA: Diagnosis not present

## 2018-10-26 DIAGNOSIS — S022XXA Fracture of nasal bones, initial encounter for closed fracture: Secondary | ICD-10-CM | POA: Diagnosis not present

## 2018-10-26 DIAGNOSIS — Z79899 Other long term (current) drug therapy: Secondary | ICD-10-CM | POA: Diagnosis not present

## 2018-10-26 DIAGNOSIS — J45909 Unspecified asthma, uncomplicated: Secondary | ICD-10-CM | POA: Insufficient documentation

## 2018-10-26 DIAGNOSIS — Z87891 Personal history of nicotine dependence: Secondary | ICD-10-CM | POA: Diagnosis not present

## 2018-10-26 DIAGNOSIS — Y939 Activity, unspecified: Secondary | ICD-10-CM | POA: Diagnosis not present

## 2018-10-26 DIAGNOSIS — Y999 Unspecified external cause status: Secondary | ICD-10-CM | POA: Diagnosis not present

## 2018-10-26 LAB — I-STAT BETA HCG BLOOD, ED (MC, WL, AP ONLY): I-stat hCG, quantitative: <5 m[IU]/mL (ref <5)

## 2018-10-26 MED ORDER — IBUPROFEN 400 MG PO TABS
600.0000 mg | ORAL_TABLET | Freq: Once | ORAL | Status: AC
  Administered 2018-10-26: 600 mg via ORAL
  Filled 2018-10-26: qty 1

## 2018-10-26 NOTE — ED Notes (Signed)
Pt given wash clothes to wipe blood off.
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
## 2018-10-26 NOTE — ED Notes (Signed)
Patient transported to CT 
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
## 2018-10-26 NOTE — ED Notes (Signed)
Patient verbalizes understanding of discharge instructions. Opportunity for questioning and answers were provided. Armband removed by staff, pt discharged from ED.  

## 2018-10-26 NOTE — ED Notes (Signed)
Patient transported to X-ray 

## 2018-10-26 NOTE — ED Notes (Signed)
Pt stable, ambulatory, states understanding of discharge instructions 
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
## 2018-10-28 ENCOUNTER — Other Ambulatory Visit: Payer: Self-pay

## 2018-10-28 ENCOUNTER — Emergency Department: Payer: Medicaid Other

## 2018-10-28 ENCOUNTER — Emergency Department
Admission: EM | Admit: 2018-10-28 | Discharge: 2018-10-28 | Disposition: A | Discharge status: Home / Self Care | Payer: Medicaid Other | Attending: Emergency Medicine | Admitting: Emergency Medicine

## 2018-10-28 ENCOUNTER — Encounter: Payer: Self-pay | Admitting: Emergency Medicine

## 2018-10-28 DIAGNOSIS — R112 Nausea with vomiting, unspecified: Secondary | ICD-10-CM | POA: Diagnosis not present

## 2018-10-28 DIAGNOSIS — Y9389 Activity, other specified: Secondary | ICD-10-CM | POA: Insufficient documentation

## 2018-10-28 DIAGNOSIS — Y929 Unspecified place or not applicable: Secondary | ICD-10-CM | POA: Diagnosis not present

## 2018-10-28 DIAGNOSIS — F1210 Cannabis abuse, uncomplicated: Secondary | ICD-10-CM | POA: Insufficient documentation

## 2018-10-28 DIAGNOSIS — R040 Epistaxis: Secondary | ICD-10-CM | POA: Diagnosis not present

## 2018-10-28 DIAGNOSIS — Y998 Other external cause status: Secondary | ICD-10-CM | POA: Diagnosis not present

## 2018-10-28 DIAGNOSIS — Z87891 Personal history of nicotine dependence: Secondary | ICD-10-CM | POA: Insufficient documentation

## 2018-10-28 DIAGNOSIS — R51 Headache: Secondary | ICD-10-CM | POA: Diagnosis not present

## 2018-10-28 MED ORDER — CYCLOBENZAPRINE HCL 5 MG PO TABS: 5.0000 mg | ORAL_TABLET | Freq: Three times a day (TID) | ORAL | 0 refills | Status: DC | PRN

## 2018-10-28 MED ORDER — ACETAMINOPHEN 325 MG PO TABS
650.0000 mg | ORAL_TABLET | Freq: Once | ORAL | Status: AC | PRN
  Administered 2018-10-28: 650 mg via ORAL
  Filled 2018-10-28: qty 2

## 2018-10-28 MED ORDER — GABAPENTIN 300 MG PO CAPS: 300.0000 mg | ORAL_CAPSULE | Freq: Three times a day (TID) | ORAL | 0 refills | Status: DC

## 2018-10-28 MED ORDER — AMOXICILLIN-POT CLAVULANATE 875-125 MG PO TABS: 1.0000 | ORAL_TABLET | Freq: Two times a day (BID) | ORAL | 0 refills | Status: AC

## 2018-10-28 MED ORDER — ONDANSETRON 4 MG PO TBDP
4.0000 mg | ORAL_TABLET | Freq: Once | ORAL | Status: AC | PRN
  Administered 2018-10-28: 4 mg via ORAL
  Filled 2018-10-28: qty 1

## 2018-10-28 NOTE — ED Notes (Signed)
[facility] [place]. PT ASKED TO MOVE DUE TO LINE OF PEOPLE CHECKING IN. PT MOVED TO OTHER OUTLET TO CHARGE PHONE. PT STS, "IM ALREADY IRRITATED AS IT IS. THIS IS STUPID AS FUCK." PT ASKED IF [facility]S RN COULD HELP HER WITH ANYTHING, PT REFUSED TO ANSWER AND CONTINUED PHONE CONVERSATION. AS THIS RN WAS WALKING AWAY, PT STS, "IF THIS DONT REACH, YOU NEED TO MOVE ME."

## 2018-10-28 NOTE — ED Notes (Signed)
Pt ambulatory to POV without difficulty. VSS. NAD. Discharge instructions, RX and follow up reviewed. All questions and concerns addressed.  

## 2018-10-28 NOTE — Discharge Instructions (Addendum)
Please seek medical attention for any high fevers, chest pain, shortness of breath, change in behavior, persistent vomiting, bloody stool or any other new or concerning symptoms.  
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
## 2018-10-28 NOTE — ED Notes (Signed)
Patient transported to CT 
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
## 2018-11-26 IMAGING — CT CT HEAD W/O CM
4 series · 15 of 47 positions shown, 17 images · non-contrast
Comparison: Prior CT from 04/30/2018

CLINICAL DATA: Initial evaluation for intermittent headache.

EXAM:
CT HEAD WITHOUT CONTRAST
TECHNIQUE: Contiguous axial images were obtained from the base of the skull
through the vertex without intravenous contrast.

[Series 3: head wo · axial · 0.42mm/px · z∈[-79,+31]mm · 7 of 30 slices shown, 9 images]
[im 4/30  brain]
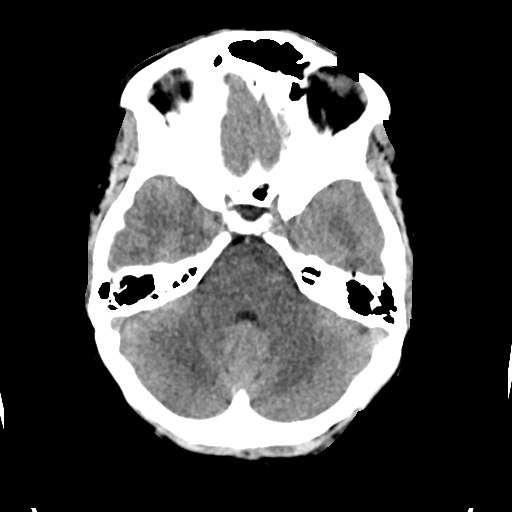
[im 4/30  bone]
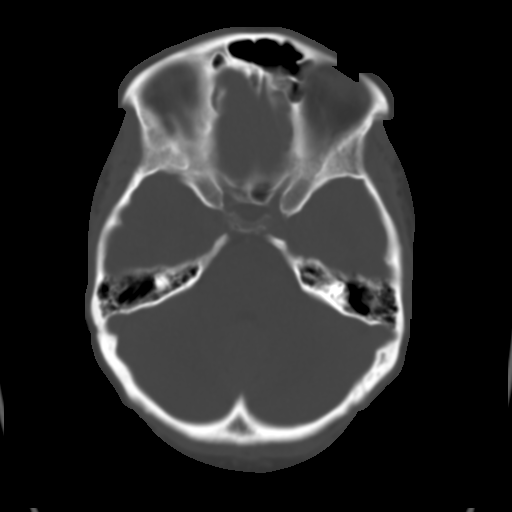
[im 8/30  brain]
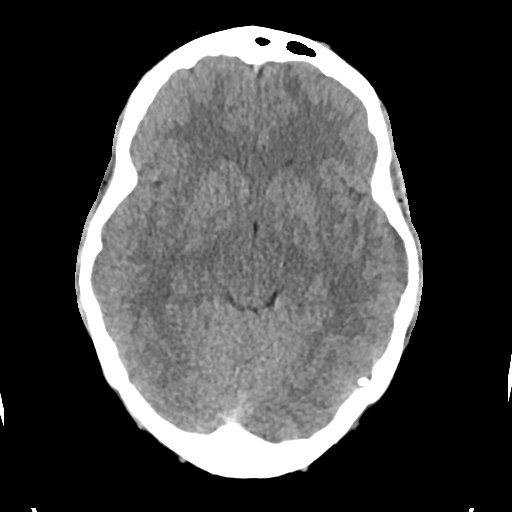
[im 11/30  brain]
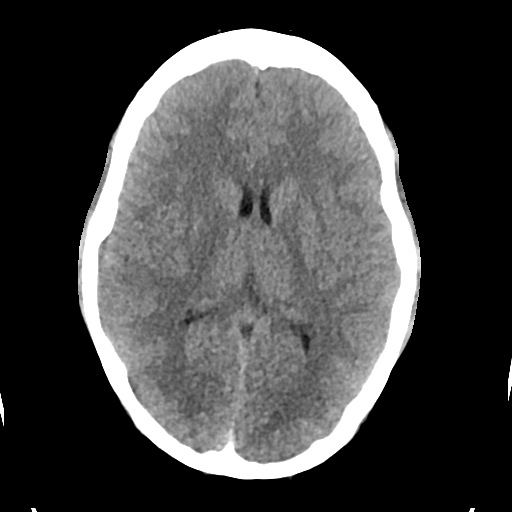
[im 15/30  brain]
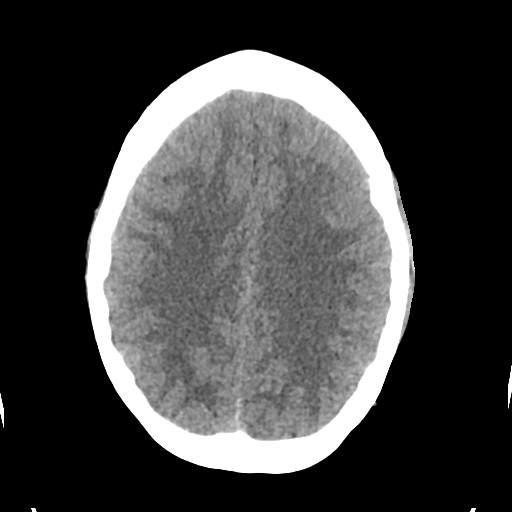
[im 19/30  brain]
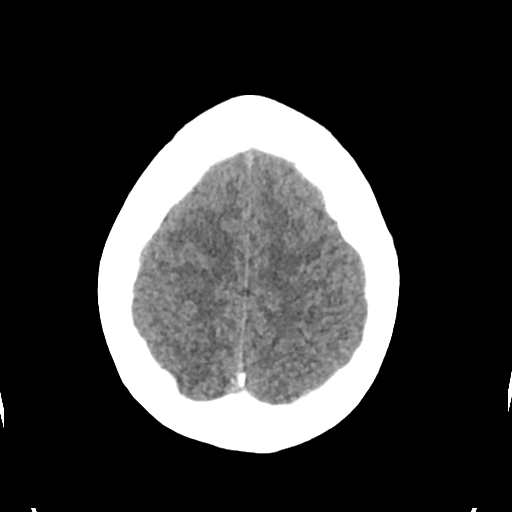
[im 19/30  bone]
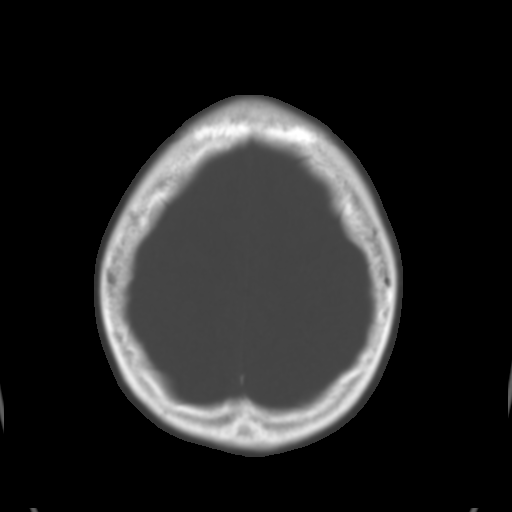
[im 22/30  brain]
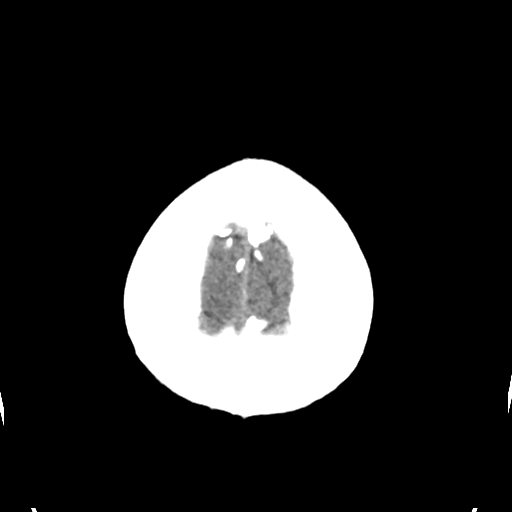
[im 26/30  brain]
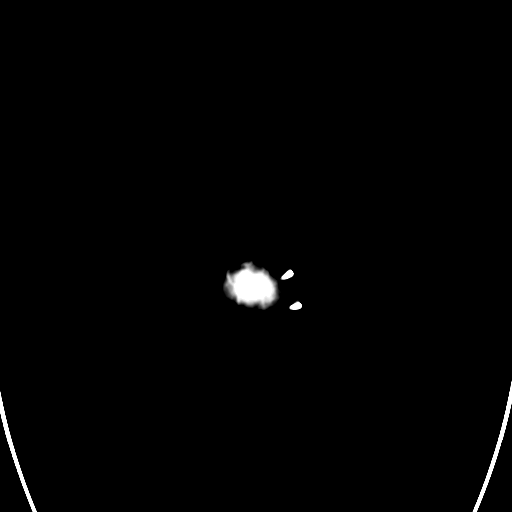

[Series 4: head bone · axial · 0.42mm/px · z∈[-80,-66]mm · 2 of 73 slices shown]
[im 8/73  bone]
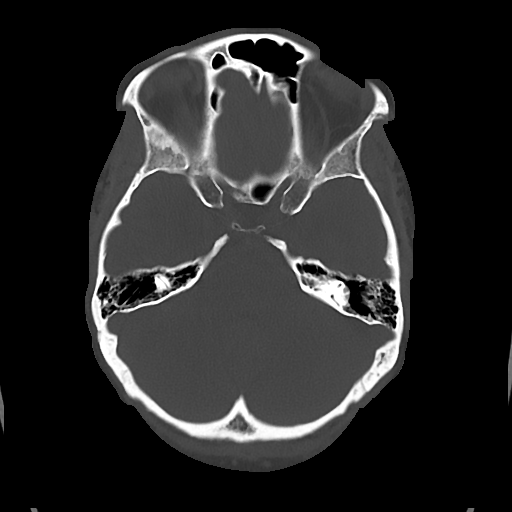
[im 15/73  bone]
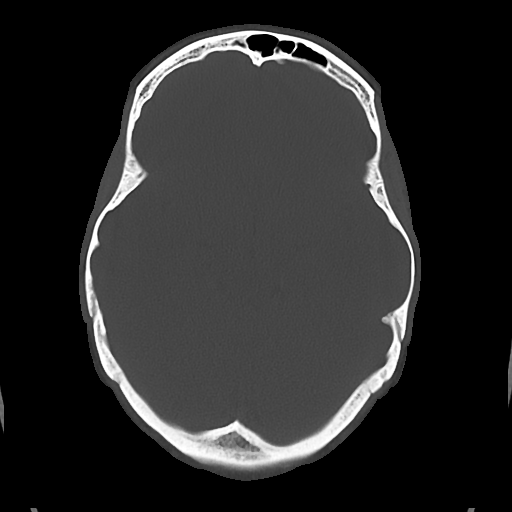

[Series 5: cor soft · coronal · 0.28mm/px · 3 of 67 slices shown]
[im 23/67  brain]
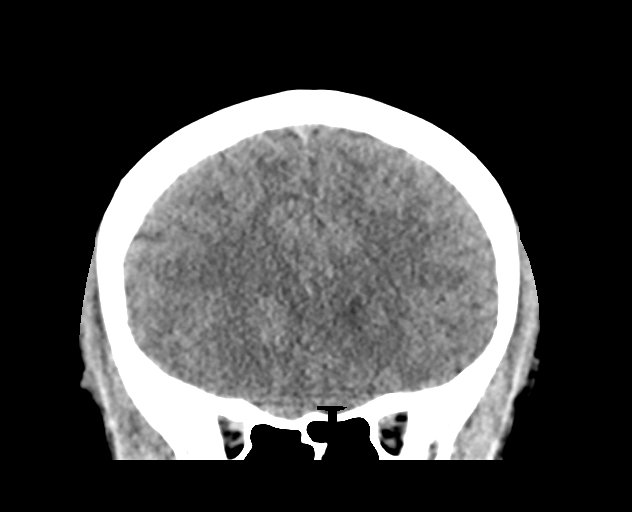
[im 30/67  brain]
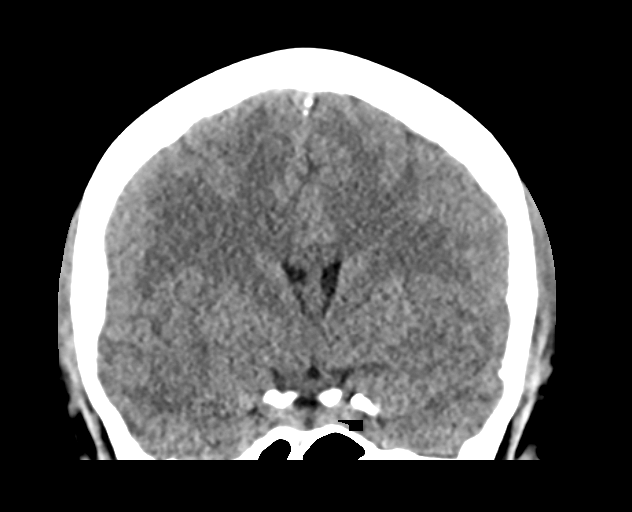
[im 37/67  brain]
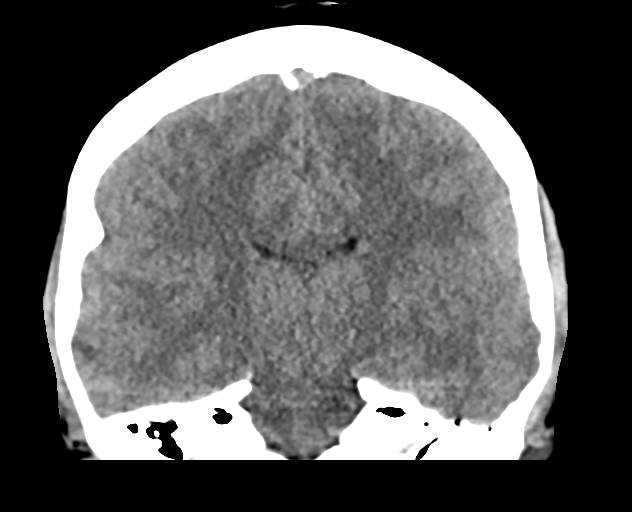

[Series 6: sag soft · sagittal · 0.26mm/px · 3 of 53 slices shown]
[im 18/53  brain]
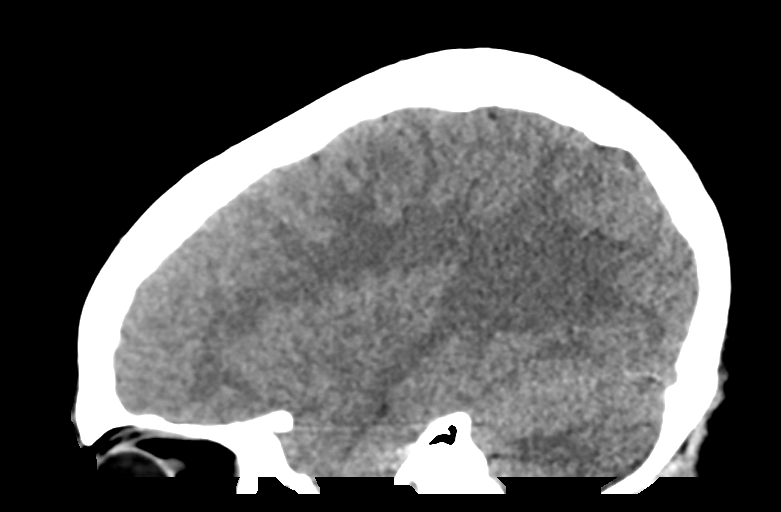
[im 27/53  brain]
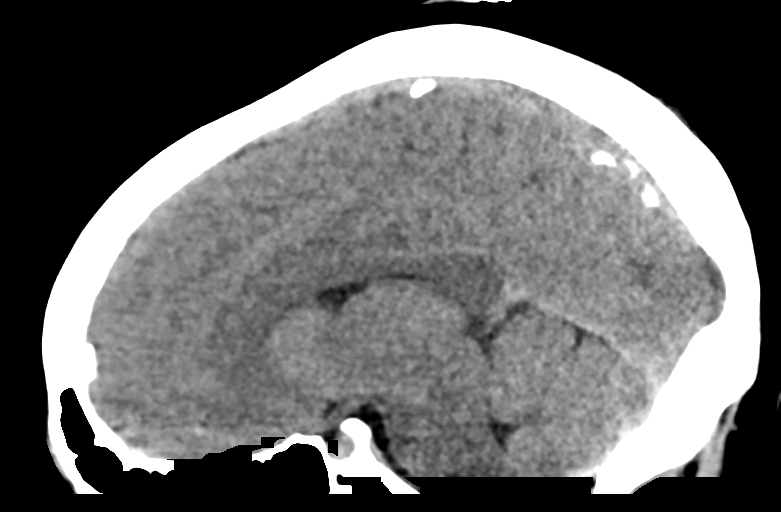
[im 35/53  brain]
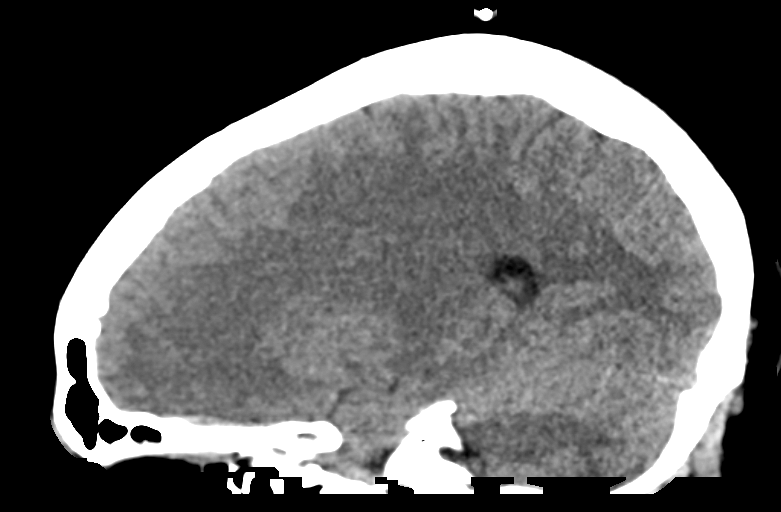

[15 of 47 positions shown; findings below may reference images not displayed]

FINDINGS: Brain: Cerebral volume within normal limits for patient age.

No evidence for acute intracranial hemorrhage. No findings to
suggest acute large vessel territory infarct. No mass lesion,
midline shift, or mass effect. Ventricles are normal in size without
evidence for hydrocephalus. No extra-axial fluid collection
identified.

Vascular: No hyperdense vessel identified.

Skull: Scalp soft tissues demonstrate no acute abnormality.
Calvarium intact.

Sinuses/Orbits: Globes and orbital soft tissues within normal
limits.

Visualized paranasal sinuses are clear. No mastoid effusion.
IMPRESSION: Normal head CT. No acute intracranial abnormality identified.

## 2018-11-27 ENCOUNTER — Ambulatory Visit (HOSPITAL_COMMUNITY)
Admission: EM | Admit: 2018-11-27 | Discharge: 2018-11-27 | Disposition: A | Discharge status: Home / Self Care | Payer: Self-pay | Attending: Family Medicine | Admitting: Family Medicine

## 2018-11-27 ENCOUNTER — Encounter (HOSPITAL_COMMUNITY): Payer: Self-pay

## 2018-11-27 DIAGNOSIS — M542 Cervicalgia: Secondary | ICD-10-CM | POA: Insufficient documentation

## 2018-11-27 MED ORDER — TIZANIDINE HCL 4 MG PO TABS: 4.0000 mg | ORAL_TABLET | Freq: Four times a day (QID) | ORAL | 0 refills | Status: DC | PRN

## 2018-11-27 MED ORDER — PREDNISONE 10 MG (21) PO TBPK: ORAL_TABLET | ORAL | 0 refills | Status: DC

## 2018-11-27 NOTE — Discharge Instructions (Signed)
We are treating you for nerve inflammation and muscle spasms. Prednisone daily for 6 days.  Make sure you take this medication with food Zanaflex every 6 hours as needed for muscle spasms Do not take any ibuprofen or Aleve while taking prednisone If your symptoms continue you need to follow-up with orthopedic for further evaluation and management
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
## 2019-03-17 ENCOUNTER — Emergency Department (HOSPITAL_BASED_OUTPATIENT_CLINIC_OR_DEPARTMENT_OTHER): Payer: Self-pay

## 2019-03-17 ENCOUNTER — Other Ambulatory Visit: Payer: Self-pay

## 2019-03-17 ENCOUNTER — Emergency Department (HOSPITAL_BASED_OUTPATIENT_CLINIC_OR_DEPARTMENT_OTHER)
Admission: EM | Admit: 2019-03-17 | Discharge: 2019-03-18 | Disposition: A | Discharge status: Home / Self Care | Payer: Self-pay | Attending: Emergency Medicine | Admitting: Emergency Medicine

## 2019-03-17 ENCOUNTER — Encounter (HOSPITAL_BASED_OUTPATIENT_CLINIC_OR_DEPARTMENT_OTHER): Payer: Self-pay

## 2019-03-17 DIAGNOSIS — Y929 Unspecified place or not applicable: Secondary | ICD-10-CM | POA: Insufficient documentation

## 2019-03-17 DIAGNOSIS — Y999 Unspecified external cause status: Secondary | ICD-10-CM | POA: Insufficient documentation

## 2019-03-17 DIAGNOSIS — Z87891 Personal history of nicotine dependence: Secondary | ICD-10-CM | POA: Insufficient documentation

## 2019-03-17 DIAGNOSIS — Z79899 Other long term (current) drug therapy: Secondary | ICD-10-CM | POA: Insufficient documentation

## 2019-03-17 DIAGNOSIS — T07XXXA Unspecified multiple injuries, initial encounter: Secondary | ICD-10-CM | POA: Insufficient documentation

## 2019-03-17 DIAGNOSIS — Y939 Activity, unspecified: Secondary | ICD-10-CM | POA: Insufficient documentation

## 2019-03-17 DIAGNOSIS — R0789 Other chest pain: Secondary | ICD-10-CM | POA: Insufficient documentation

## 2019-03-17 DIAGNOSIS — J45909 Unspecified asthma, uncomplicated: Secondary | ICD-10-CM | POA: Insufficient documentation

## 2019-03-17 DIAGNOSIS — R10819 Abdominal tenderness, unspecified site: Secondary | ICD-10-CM | POA: Insufficient documentation

## 2019-03-17 DIAGNOSIS — R1012 Left upper quadrant pain: Secondary | ICD-10-CM | POA: Insufficient documentation

## 2019-03-17 MED ORDER — OXYCODONE-ACETAMINOPHEN 5-325 MG PO TABS
1.0000 | ORAL_TABLET | Freq: Once | ORAL | Status: AC
  Administered 2019-03-18: 1 via ORAL
  Filled 2019-03-17: qty 1
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
## 2019-03-18 ENCOUNTER — Emergency Department (HOSPITAL_BASED_OUTPATIENT_CLINIC_OR_DEPARTMENT_OTHER): Payer: Self-pay

## 2019-03-18 LAB — HCG, SERUM, QUALITATIVE: Preg, Serum: NEGATIVE

## 2019-03-18 MED ORDER — METHOCARBAMOL 500 MG PO TABS: 500.0000 mg | ORAL_TABLET | Freq: Three times a day (TID) | ORAL | 0 refills | Status: DC | PRN

## 2019-03-18 MED ORDER — IBUPROFEN 600 MG PO TABS: 600.0000 mg | ORAL_TABLET | Freq: Four times a day (QID) | ORAL | 0 refills | Status: DC | PRN

## 2019-03-18 MED ORDER — IOHEXOL 300 MG/ML  SOLN
100.0000 mL | Freq: Once | INTRAMUSCULAR | Status: AC | PRN
  Administered 2019-03-18: 100 mL via INTRAVENOUS

## 2019-03-18 MED ORDER — KETOROLAC TROMETHAMINE 30 MG/ML IJ SOLN
30.0000 mg | Freq: Once | INTRAMUSCULAR | Status: AC
  Administered 2019-03-18: 30 mg via INTRAVENOUS
  Filled 2019-03-18: qty 1

## 2019-03-18 NOTE — ED Notes (Signed)
Patient transported to CT 
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
## 2019-03-18 NOTE — ED Notes (Signed)
Pt ambulated to just outside her doorway  Pt c/o pain to her left flank area when she puts pressure on her left leg

## 2019-07-21 ENCOUNTER — Other Ambulatory Visit: Payer: Self-pay

## 2019-07-21 ENCOUNTER — Emergency Department (HOSPITAL_COMMUNITY)
Admission: EM | Admit: 2019-07-21 | Discharge: 2019-07-21 | Disposition: A | Discharge status: Home / Self Care | Payer: Self-pay | Attending: Emergency Medicine | Admitting: Emergency Medicine

## 2019-07-21 ENCOUNTER — Encounter (HOSPITAL_COMMUNITY): Payer: Self-pay

## 2019-07-21 DIAGNOSIS — R112 Nausea with vomiting, unspecified: Secondary | ICD-10-CM | POA: Insufficient documentation

## 2019-07-21 DIAGNOSIS — J45909 Unspecified asthma, uncomplicated: Secondary | ICD-10-CM | POA: Insufficient documentation

## 2019-07-21 DIAGNOSIS — R438 Other disturbances of smell and taste: Secondary | ICD-10-CM | POA: Insufficient documentation

## 2019-07-21 DIAGNOSIS — N390 Urinary tract infection, site not specified: Secondary | ICD-10-CM

## 2019-07-21 DIAGNOSIS — R1030 Lower abdominal pain, unspecified: Secondary | ICD-10-CM | POA: Insufficient documentation

## 2019-07-21 DIAGNOSIS — M545 Low back pain: Secondary | ICD-10-CM | POA: Insufficient documentation

## 2019-07-21 DIAGNOSIS — M791 Myalgia, unspecified site: Secondary | ICD-10-CM | POA: Insufficient documentation

## 2019-07-21 DIAGNOSIS — Z79899 Other long term (current) drug therapy: Secondary | ICD-10-CM | POA: Insufficient documentation

## 2019-07-21 DIAGNOSIS — Z7189 Other specified counseling: Secondary | ICD-10-CM

## 2019-07-21 DIAGNOSIS — Z20828 Contact with and (suspected) exposure to other viral communicable diseases: Secondary | ICD-10-CM | POA: Insufficient documentation

## 2019-07-21 LAB — COMPREHENSIVE METABOLIC PANEL
ALT: 17 U/L (ref 0–44)
AST: 17 U/L (ref 15–41)
Albumin: 4 g/dL (ref 3.5–5.0)
Alkaline Phosphatase: 68 U/L (ref 38–126)
Anion gap: 9 (ref 5–15)
BUN: 11 mg/dL (ref 6–20)
CO2: 23 mmol/L (ref 22–32)
Calcium: 8.7 mg/dL — ABNORMAL LOW (ref 8.9–10.3)
Chloride: 108 mmol/L (ref 98–111)
Creatinine, Ser: 0.89 mg/dL (ref 0.44–1.00)
GFR calc Af Amer: >60 mL/min (ref >60)
GFR calc non Af Amer: >60 mL/min (ref >60)
Glucose, Bld: 86 mg/dL (ref 70–99)
Potassium: 3.8 mmol/L (ref 3.5–5.1)
Sodium: 140 mmol/L (ref 135–145)
Total Bilirubin: 0.8 mg/dL (ref 0.3–1.2)
Total Protein: 7.7 g/dL (ref 6.5–8.1)

## 2019-07-21 LAB — URINALYSIS, ROUTINE W REFLEX MICROSCOPIC
Bilirubin Urine: NEGATIVE
Glucose, UA: NEGATIVE mg/dL
Ketones, ur: NEGATIVE mg/dL
Nitrite: NEGATIVE
Protein, ur: 100 mg/dL — AB
RBC / HPF: >50 RBC/hpf — ABNORMAL HIGH (ref 0–5)
Specific Gravity, Urine: 1.021 (ref 1.005–1.030)
WBC, UA: >50 WBC/hpf — ABNORMAL HIGH (ref 0–5)
pH: 8 (ref 5.0–8.0)

## 2019-07-21 LAB — CBC
HCT: 40.3 % (ref 36.0–46.0)
Hemoglobin: 12.8 g/dL (ref 12.0–15.0)
MCH: 30.2 pg (ref 26.0–34.0)
MCHC: 31.8 g/dL (ref 30.0–36.0)
MCV: 95 fL (ref 80.0–100.0)
Platelets: 266 10*3/uL (ref 150–400)
RBC: 4.24 MIL/uL (ref 3.87–5.11)
RDW: 12.3 % (ref 11.5–15.5)
WBC: 11.7 10*3/uL — ABNORMAL HIGH (ref 4.0–10.5)
nRBC: 0 % (ref 0.0–0.2)

## 2019-07-21 LAB — PREGNANCY, URINE: Preg Test, Ur: NEGATIVE

## 2019-07-21 LAB — LIPASE, BLOOD: Lipase: 28 U/L (ref 11–51)

## 2019-07-21 LAB — SARS CORONAVIRUS 2 (TAT 6-24 HRS): SARS Coronavirus 2: NEGATIVE

## 2019-07-21 MED ORDER — CEPHALEXIN 500 MG PO CAPS: 500.0000 mg | ORAL_CAPSULE | Freq: Four times a day (QID) | ORAL | 0 refills | Status: DC

## 2019-07-21 MED ORDER — SODIUM CHLORIDE 0.9% FLUSH: 3.0000 mL | Freq: Once | INTRAVENOUS | Status: DC

## 2019-07-21 MED ORDER — CEPHALEXIN 500 MG PO CAPS
500.0000 mg | ORAL_CAPSULE | Freq: Once | ORAL | Status: AC
  Administered 2019-07-21: 500 mg via ORAL
  Filled 2019-07-21: qty 1
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
## 2019-07-21 NOTE — ED Triage Notes (Addendum)
Patient c/o abdominal pain, body aches x 4 days. Patient states she vomited 4 days ago only that day. Patient is afebrile. Patient states she was 8 days late for her period and now she is having clots and states the clots hve been worse since having a tubal ligation.

## 2019-07-21 NOTE — Discharge Instructions (Addendum)
It was our pleasure to provide your ER care today - we hope that you feel better.  Take antibiotic as prescribed.  Drink plenty of fluids.   Take acetaminophen or ibuprofen as need.  Your covid test should return within the next 24 hours - you may call to inquire of result.  Once test is back, and if negative, you may return to work.  Return to ER if worse, new symptoms, fevers, new/worsening or severe pain, persistent vomiting, or other concern.

## 2019-10-06 ENCOUNTER — Emergency Department (HOSPITAL_COMMUNITY)
Admission: EM | Admit: 2019-10-06 | Discharge: 2019-10-06 | Disposition: A | Discharge status: Home / Self Care | Payer: Self-pay | Attending: Emergency Medicine | Admitting: Emergency Medicine

## 2019-10-06 ENCOUNTER — Other Ambulatory Visit: Payer: Self-pay

## 2019-10-06 ENCOUNTER — Encounter (HOSPITAL_COMMUNITY): Payer: Self-pay | Admitting: Obstetrics and Gynecology

## 2019-10-06 ENCOUNTER — Emergency Department (HOSPITAL_COMMUNITY): Payer: Self-pay

## 2019-10-06 DIAGNOSIS — Z5321 Procedure and treatment not carried out due to patient leaving prior to being seen by health care provider: Secondary | ICD-10-CM | POA: Insufficient documentation

## 2019-10-06 DIAGNOSIS — M79641 Pain in right hand: Secondary | ICD-10-CM | POA: Insufficient documentation
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
## 2020-01-25 ENCOUNTER — Ambulatory Visit: Payer: Self-pay | Admitting: Adult Health

## 2020-02-20 ENCOUNTER — Other Ambulatory Visit: Payer: Self-pay

## 2020-02-20 ENCOUNTER — Emergency Department (HOSPITAL_COMMUNITY)
Admission: EM | Admit: 2020-02-20 | Discharge: 2020-02-20 | Disposition: A | Discharge status: Home / Self Care | Payer: Medicaid Other | Attending: Emergency Medicine | Admitting: Emergency Medicine

## 2020-02-20 DIAGNOSIS — Z79899 Other long term (current) drug therapy: Secondary | ICD-10-CM | POA: Insufficient documentation

## 2020-02-20 DIAGNOSIS — Z77098 Contact with and (suspected) exposure to other hazardous, chiefly nonmedicinal, chemicals: Secondary | ICD-10-CM | POA: Diagnosis not present

## 2020-02-20 DIAGNOSIS — J45909 Unspecified asthma, uncomplicated: Secondary | ICD-10-CM | POA: Insufficient documentation

## 2020-02-20 DIAGNOSIS — T7840XA Allergy, unspecified, initial encounter: Secondary | ICD-10-CM | POA: Diagnosis present

## 2020-02-20 MED ORDER — LIDOCAINE VISCOUS HCL 2 % MT SOLN
15.0000 mL | Freq: Once | OROMUCOSAL | Status: AC
  Administered 2020-02-20: 15 mL via ORAL
  Filled 2020-02-20: qty 15

## 2020-02-20 MED ORDER — FLUORESCEIN SODIUM 1 MG OP STRP
ORAL_STRIP | OPHTHALMIC | Status: AC
  Filled 2020-02-20: qty 1

## 2020-02-20 MED ORDER — TETRACAINE HCL 0.5 % OP SOLN
1.0000 [drp] | Freq: Once | OPHTHALMIC | Status: AC
  Administered 2020-02-20: 1 [drp] via OPHTHALMIC
  Filled 2020-02-20: qty 4

## 2020-02-20 MED ORDER — ALUM & MAG HYDROXIDE-SIMETH 200-200-20 MG/5ML PO SUSP
30.0000 mL | Freq: Once | ORAL | Status: AC
  Administered 2020-02-20: 30 mL via ORAL
  Filled 2020-02-20: qty 30
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
## 2020-02-20 NOTE — ED Notes (Signed)
Applied morgan lens x 2 with 2 liters nacl  Pt reports improvement.
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
## 2020-04-20 ENCOUNTER — Ambulatory Visit: Payer: Medicaid Other | Admitting: Obstetrics & Gynecology

## 2020-05-03 ENCOUNTER — Other Ambulatory Visit (HOSPITAL_COMMUNITY)
Admission: RE | Admit: 2020-05-03 | Discharge: 2020-05-03 | Disposition: A | Discharge status: Home / Self Care | Payer: Medicaid Other | Source: Ambulatory Visit | Attending: Obstetrics & Gynecology | Admitting: Obstetrics & Gynecology

## 2020-05-03 ENCOUNTER — Encounter: Payer: Self-pay | Admitting: Obstetrics

## 2020-05-03 ENCOUNTER — Other Ambulatory Visit (HOSPITAL_COMMUNITY)
Admission: RE | Admit: 2020-05-03 | Discharge: 2020-05-03 | Disposition: A | Discharge status: Home / Self Care | Payer: Medicaid Other | Source: Ambulatory Visit | Attending: Obstetrics | Admitting: Obstetrics

## 2020-05-03 ENCOUNTER — Ambulatory Visit (INDEPENDENT_AMBULATORY_CARE_PROVIDER_SITE_OTHER): Payer: Medicaid Other | Admitting: Obstetrics

## 2020-05-03 ENCOUNTER — Other Ambulatory Visit: Payer: Self-pay

## 2020-05-03 VITALS — BP 135/85 | HR 65 | Ht 67.0 in | Wt 225.5 lb

## 2020-05-03 DIAGNOSIS — Z0000 Encounter for general adult medical examination without abnormal findings: Secondary | ICD-10-CM | POA: Diagnosis not present

## 2020-05-03 DIAGNOSIS — Z113 Encounter for screening for infections with a predominantly sexual mode of transmission: Secondary | ICD-10-CM

## 2020-05-03 DIAGNOSIS — Z91018 Allergy to other foods: Secondary | ICD-10-CM | POA: Diagnosis not present

## 2020-05-03 DIAGNOSIS — J45909 Unspecified asthma, uncomplicated: Secondary | ICD-10-CM | POA: Diagnosis not present

## 2020-05-03 DIAGNOSIS — Z01419 Encounter for gynecological examination (general) (routine) without abnormal findings: Secondary | ICD-10-CM

## 2020-05-03 DIAGNOSIS — Z202 Contact with and (suspected) exposure to infections with a predominantly sexual mode of transmission: Secondary | ICD-10-CM | POA: Diagnosis not present

## 2020-05-03 DIAGNOSIS — N898 Other specified noninflammatory disorders of vagina: Secondary | ICD-10-CM | POA: Insufficient documentation

## 2020-05-03 DIAGNOSIS — Z8781 Personal history of (healed) traumatic fracture: Secondary | ICD-10-CM

## 2020-05-03 DIAGNOSIS — E669 Obesity, unspecified: Secondary | ICD-10-CM | POA: Diagnosis not present

## 2020-05-03 DIAGNOSIS — Z91013 Allergy to seafood: Secondary | ICD-10-CM | POA: Diagnosis not present

## 2020-05-03 DIAGNOSIS — Z6835 Body mass index (BMI) 35.0-35.9, adult: Secondary | ICD-10-CM

## 2020-05-03 LAB — PREGNANCY, URINE: Preg Test, Ur: NEGATIVE

## 2020-05-03 LAB — WET PREP, GENITAL
Sperm: NONE SEEN
Trich, Wet Prep: NONE SEEN
Yeast Wet Prep HPF POC: NONE SEEN

## 2020-05-03 LAB — URINALYSIS, ROUTINE W REFLEX MICROSCOPIC
Bilirubin Urine: NEGATIVE
Glucose, UA: NEGATIVE mg/dL
Ketones, ur: NEGATIVE mg/dL
Leukocytes,Ua: NEGATIVE
Nitrite: NEGATIVE
Protein, ur: NEGATIVE mg/dL
Specific Gravity, Urine: 1.015 (ref 1.005–1.030)
pH: 6 (ref 5.0–8.0)

## 2020-05-03 LAB — URINALYSIS, MICROSCOPIC (REFLEX): WBC, UA: NONE SEEN WBC/hpf (ref 0–5)

## 2020-05-03 NOTE — ED Notes (Signed)
Initial triage chart error

## 2020-05-03 NOTE — ED Triage Notes (Addendum)
Pt c/o vaginal d/c x 3 days-NAD-steady gait-pt later added she was seen by GYN today-states she had STD cultures but no abx-states she wants meds to treat possible STD today
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
## 2020-05-03 NOTE — Progress Notes (Signed)
New Patient in the office for annual.  Last pap 06-25-16

## 2020-05-04 ENCOUNTER — Other Ambulatory Visit: Payer: Self-pay | Admitting: Obstetrics

## 2020-05-04 ENCOUNTER — Emergency Department (HOSPITAL_BASED_OUTPATIENT_CLINIC_OR_DEPARTMENT_OTHER)
Admission: EM | Admit: 2020-05-04 | Discharge: 2020-05-04 | Disposition: A | Discharge status: Home / Self Care | Payer: Medicaid Other | Attending: Emergency Medicine | Admitting: Emergency Medicine

## 2020-05-04 DIAGNOSIS — B373 Candidiasis of vulva and vagina: Secondary | ICD-10-CM

## 2020-05-04 DIAGNOSIS — B9689 Other specified bacterial agents as the cause of diseases classified elsewhere: Secondary | ICD-10-CM

## 2020-05-04 DIAGNOSIS — B3731 Acute candidiasis of vulva and vagina: Secondary | ICD-10-CM

## 2020-05-04 DIAGNOSIS — N760 Acute vaginitis: Secondary | ICD-10-CM

## 2020-05-04 DIAGNOSIS — Z202 Contact with and (suspected) exposure to infections with a predominantly sexual mode of transmission: Secondary | ICD-10-CM

## 2020-05-04 DIAGNOSIS — N898 Other specified noninflammatory disorders of vagina: Secondary | ICD-10-CM

## 2020-05-04 LAB — HIV ANTIBODY (ROUTINE TESTING W REFLEX): HIV Screen 4th Generation wRfx: NONREACTIVE

## 2020-05-04 LAB — CERVICOVAGINAL ANCILLARY ONLY
Bacterial Vaginitis (gardnerella): POSITIVE — AB
Candida Glabrata: NEGATIVE
Candida Vaginitis: POSITIVE — AB
Chlamydia: NEGATIVE
Comment: NEGATIVE
Comment: NEGATIVE
Comment: NEGATIVE
Comment: NEGATIVE
Comment: NEGATIVE
Comment: NORMAL
Neisseria Gonorrhea: POSITIVE — AB
Trichomonas: NEGATIVE

## 2020-05-04 LAB — RPR: RPR Ser Ql: NONREACTIVE

## 2020-05-04 LAB — HEPATITIS C ANTIBODY: Hep C Virus Ab: 0.6 s/co ratio (ref 0.0–0.9)

## 2020-05-04 LAB — HEPATITIS B SURFACE ANTIGEN: Hepatitis B Surface Ag: NEGATIVE

## 2020-05-04 MED ORDER — FLUCONAZOLE 150 MG PO TABS: 150.0000 mg | ORAL_TABLET | Freq: Once | ORAL | 0 refills | Status: AC

## 2020-05-04 MED ORDER — METRONIDAZOLE 500 MG PO TABS: 500.0000 mg | ORAL_TABLET | Freq: Two times a day (BID) | ORAL | 2 refills | Status: DC

## 2020-05-04 MED ORDER — CEFTRIAXONE SODIUM 500 MG IJ SOLR
500.0000 mg | Freq: Once | INTRAMUSCULAR | Status: AC
  Administered 2020-05-04: 500 mg via INTRAMUSCULAR
  Filled 2020-05-04: qty 500

## 2020-05-04 MED ORDER — AZITHROMYCIN 1 G PO PACK
1.0000 g | PACK | Freq: Once | ORAL | Status: AC
  Administered 2020-05-04: 1 g via ORAL
  Filled 2020-05-04: qty 1
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
## 2020-05-05 LAB — GC/CHLAMYDIA PROBE AMP (~~LOC~~) NOT AT ARMC
Chlamydia: NEGATIVE
Comment: NEGATIVE
Comment: NORMAL
Neisseria Gonorrhea: NEGATIVE

## 2020-05-05 LAB — CYTOLOGY - PAP: Diagnosis: NEGATIVE

## 2020-05-12 ENCOUNTER — Other Ambulatory Visit: Payer: Self-pay

## 2020-05-12 DIAGNOSIS — B9689 Other specified bacterial agents as the cause of diseases classified elsewhere: Secondary | ICD-10-CM

## 2020-05-12 MED ORDER — METRONIDAZOLE 0.75 % VA GEL: 1.0000 | Freq: Every day | VAGINAL | 1 refills | Status: DC

## 2020-05-12 NOTE — Progress Notes (Signed)
Pt cannot tolerate Rx for BV PO Sent Metro gel pt made aware to R/S TOC appt for 6 wks .

## 2020-06-22 ENCOUNTER — Ambulatory Visit: Payer: Medicaid Other

## 2021-03-10 ENCOUNTER — Other Ambulatory Visit: Payer: Self-pay

## 2021-03-10 ENCOUNTER — Ambulatory Visit (INDEPENDENT_AMBULATORY_CARE_PROVIDER_SITE_OTHER): Payer: Medicaid Other

## 2021-03-10 ENCOUNTER — Ambulatory Visit (HOSPITAL_COMMUNITY)
Admission: EM | Admit: 2021-03-10 | Discharge: 2021-03-10 | Disposition: A | Discharge status: Home / Self Care | Payer: Medicaid Other | Attending: Urgent Care | Admitting: Urgent Care

## 2021-03-10 ENCOUNTER — Encounter (HOSPITAL_COMMUNITY): Payer: Self-pay

## 2021-03-10 DIAGNOSIS — S62600A Fracture of unspecified phalanx of right index finger, initial encounter for closed fracture: Secondary | ICD-10-CM

## 2021-03-10 DIAGNOSIS — S63250A Unspecified dislocation of right index finger, initial encounter: Secondary | ICD-10-CM | POA: Diagnosis not present

## 2021-03-10 DIAGNOSIS — M79644 Pain in right finger(s): Secondary | ICD-10-CM

## 2021-03-10 DIAGNOSIS — S63259A Unspecified dislocation of unspecified finger, initial encounter: Secondary | ICD-10-CM

## 2021-03-10 MED ORDER — NAPROXEN 500 MG PO TABS
500.0000 mg | ORAL_TABLET | Freq: Two times a day (BID) | ORAL | 0 refills | Status: DC
Start: 2021-03-10 — End: 2021-08-24
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
## 2021-03-10 NOTE — ED Notes (Addendum)
RN placed splint on pt's finger, ok per [facility].

## 2021-03-10 NOTE — Discharge Instructions (Addendum)
You have refused reduction of your finger dislocation and any form of treatment from [place]. This will increase your risk for complications including fracture, torn ligaments, prolonged pain. You can use naproxen for pain and inflammation. You may contact a hand specialist for further options regarding your treatment.

## 2021-07-25 ENCOUNTER — Ambulatory Visit: Payer: Self-pay | Admitting: *Deleted

## 2021-07-25 NOTE — Telephone Encounter (Signed)
Patient states she has torn ligament in her finger(diagnosed over 1 year ago). She has made NP appointment and is on cancellation list. Patient advised for pain/changes in symptoms before appointment- see UC.

## 2021-07-25 NOTE — Telephone Encounter (Signed)
Summary: Clinical Advice   Patient scheduled a NPA with Dr. Charlotta Newton for 09/12/2021 (first available). Patient experiencing right hand index finger pain, unable to bend and write for over a year. Patient currently has insurance now seeking clinical advice. Patient was never seen at [facility] and she is not a patient of [facility].      Reason for Disposition  [1] SEVERE pain (e.g., excruciating) AND [2] not improved after 2 hours of pain medicine  Answer Assessment - Initial Assessment Questions 1. ONSET: "When did the pain start?"      Over 1 year 2. LOCATION and RADIATION: "Where is the pain located?"  (e.g., fingertip, around nail, joint, entire  finger)      R hand- index finger- whole finger- at knuckle 3. SEVERITY: "How bad is the pain?" "What does it keep you from doing?"   (Scale 1-10; or mild, moderate, severe)  - MILD (1-3): doesn't interfere with normal activities.   - MODERATE (4-7): interferes with normal activities or awakens from sleep.  - SEVERE (8-10): excruciating pain, unable to hold a glass of water or bend finger even a little.     Intensity changes- moderate to severe 4. APPEARANCE: "What does the finger look like?" (e.g., redness, swelling, bruising, pallor)     swelling 5. WORK OR EXERCISE: "Has there been any recent work or exercise that involved this part (i.e., fingers or hand) of the body?"     Diagnosed- ligament tear 6. CAUSE: "What do you think is causing the pain?"     Ligament- needs surgery 7. AGGRAVATING FACTORS: "What makes the pain worse?" (e.g., using computer)     Weather makes worse- overuse- writing 8. OTHER SYMPTOMS: "Do you have any other symptoms?" (e.g., fever, neck pain, numbness)     Numbness with overuse, cramping- weakness in hand at times 9. PREGNANCY: "Is there any chance you are pregnant?" "When was your last menstrual period?"     No- LMP- end of August  Protocols used: Finger Pain-A-AH

## 2021-08-11 ENCOUNTER — Ambulatory Visit (HOSPITAL_COMMUNITY)
Admission: EM | Admit: 2021-08-11 | Discharge: 2021-08-11 | Disposition: A | Discharge status: Home / Self Care | Payer: Medicaid Other | Attending: Physician Assistant | Admitting: Physician Assistant

## 2021-08-11 ENCOUNTER — Other Ambulatory Visit: Payer: Self-pay

## 2021-08-11 ENCOUNTER — Ambulatory Visit (HOSPITAL_COMMUNITY)
Admission: EM | Admit: 2021-08-11 | Discharge: 2021-08-11 | Disposition: A | Discharge status: Home / Self Care | Payer: Medicaid Other | Source: Home / Self Care

## 2021-08-11 ENCOUNTER — Encounter (HOSPITAL_COMMUNITY): Payer: Self-pay

## 2021-08-11 DIAGNOSIS — N760 Acute vaginitis: Secondary | ICD-10-CM | POA: Insufficient documentation

## 2021-08-11 DIAGNOSIS — N3000 Acute cystitis without hematuria: Secondary | ICD-10-CM | POA: Diagnosis not present

## 2021-08-11 LAB — POCT URINALYSIS DIPSTICK, ED / UC
Bilirubin Urine: NEGATIVE
Glucose, UA: NEGATIVE mg/dL
Ketones, ur: NEGATIVE mg/dL
Nitrite: NEGATIVE
Protein, ur: NEGATIVE mg/dL
Specific Gravity, Urine: 1.02 (ref 1.005–1.030)
Urobilinogen, UA: 0.2 mg/dL (ref 0.0–1.0)
pH: 6 (ref 5.0–8.0)

## 2021-08-11 LAB — POC URINE PREG, ED: Preg Test, Ur: NEGATIVE

## 2021-08-11 MED ORDER — FLUCONAZOLE 150 MG PO TABS
150.0000 mg | ORAL_TABLET | Freq: Every day | ORAL | 0 refills | Status: DC
Start: 2021-08-11 — End: 2022-09-08

## 2021-08-11 MED ORDER — NITROFURANTOIN MONOHYD MACRO 100 MG PO CAPS
100.0000 mg | ORAL_CAPSULE | Freq: Two times a day (BID) | ORAL | 0 refills | Status: DC
Start: 2021-08-11 — End: 2022-09-08

## 2021-08-11 NOTE — ED Triage Notes (Signed)
Pt presents with vaginal irritation, itching and discharge; pt states symptoms increased when she used OTC monistat cream
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
## 2021-08-11 NOTE — ED Notes (Signed)
Called patient to return to UC to complete vaginal swab; pt states she will come back in 40 minutes.

## 2021-08-11 NOTE — Discharge Instructions (Addendum)
Please take antibiotic and diflucan as prescribed. Follow up if no improvement of symptoms or if symptoms worsen in any way.

## 2021-08-13 LAB — URINE CULTURE: Culture: >=100000 — AB

## 2021-08-14 LAB — CERVICOVAGINAL ANCILLARY ONLY
Bacterial Vaginitis (gardnerella): POSITIVE — AB
Candida Glabrata: NEGATIVE
Candida Vaginitis: POSITIVE — AB
Chlamydia: NEGATIVE
Comment: NEGATIVE
Comment: NEGATIVE
Comment: NEGATIVE
Comment: NEGATIVE
Comment: NEGATIVE
Comment: NORMAL
Neisseria Gonorrhea: NEGATIVE
Trichomonas: POSITIVE — AB

## 2021-08-15 ENCOUNTER — Telehealth (HOSPITAL_COMMUNITY): Payer: Self-pay | Admitting: Emergency Medicine

## 2021-08-15 MED ORDER — METRONIDAZOLE 500 MG PO TABS: 500.0000 mg | ORAL_TABLET | Freq: Two times a day (BID) | ORAL | 0 refills | Status: DC

## 2021-08-24 ENCOUNTER — Emergency Department (HOSPITAL_COMMUNITY)
Admission: EM | Admit: 2021-08-24 | Discharge: 2021-08-24 | Disposition: A | Discharge status: Home / Self Care | Payer: Medicaid Other | Attending: Emergency Medicine | Admitting: Emergency Medicine

## 2021-08-24 ENCOUNTER — Encounter (HOSPITAL_COMMUNITY): Payer: Self-pay | Admitting: Emergency Medicine

## 2021-08-24 ENCOUNTER — Emergency Department (HOSPITAL_COMMUNITY): Payer: Medicaid Other

## 2021-08-24 DIAGNOSIS — S199XXA Unspecified injury of neck, initial encounter: Secondary | ICD-10-CM | POA: Insufficient documentation

## 2021-08-24 DIAGNOSIS — S8991XA Unspecified injury of right lower leg, initial encounter: Secondary | ICD-10-CM | POA: Insufficient documentation

## 2021-08-24 DIAGNOSIS — S8992XA Unspecified injury of left lower leg, initial encounter: Secondary | ICD-10-CM | POA: Insufficient documentation

## 2021-08-24 DIAGNOSIS — S299XXA Unspecified injury of thorax, initial encounter: Secondary | ICD-10-CM | POA: Insufficient documentation

## 2021-08-24 DIAGNOSIS — S6991XA Unspecified injury of right wrist, hand and finger(s), initial encounter: Secondary | ICD-10-CM | POA: Diagnosis not present

## 2021-08-24 DIAGNOSIS — S63259A Unspecified dislocation of unspecified finger, initial encounter: Secondary | ICD-10-CM

## 2021-08-24 DIAGNOSIS — S59902A Unspecified injury of left elbow, initial encounter: Secondary | ICD-10-CM | POA: Insufficient documentation

## 2021-08-24 DIAGNOSIS — Y92410 Unspecified street and highway as the place of occurrence of the external cause: Secondary | ICD-10-CM | POA: Insufficient documentation

## 2021-08-24 DIAGNOSIS — R55 Syncope and collapse: Secondary | ICD-10-CM | POA: Diagnosis not present

## 2021-08-24 DIAGNOSIS — J45909 Unspecified asthma, uncomplicated: Secondary | ICD-10-CM | POA: Insufficient documentation

## 2021-08-24 DIAGNOSIS — S0083XA Contusion of other part of head, initial encounter: Secondary | ICD-10-CM | POA: Insufficient documentation

## 2021-08-24 DIAGNOSIS — S0990XA Unspecified injury of head, initial encounter: Secondary | ICD-10-CM | POA: Diagnosis present

## 2021-08-24 DIAGNOSIS — S20212A Contusion of left front wall of thorax, initial encounter: Secondary | ICD-10-CM

## 2021-08-24 MED ORDER — OXYCODONE-ACETAMINOPHEN 5-325 MG PO TABS: 1.0000 | ORAL_TABLET | ORAL | 0 refills | Status: DC | PRN

## 2021-08-24 MED ORDER — NAPROXEN 375 MG PO TABS: 375.0000 mg | ORAL_TABLET | Freq: Two times a day (BID) | ORAL | 0 refills | Status: DC

## 2021-08-24 MED ORDER — ONDANSETRON HCL 4 MG PO TABS: 4.0000 mg | ORAL_TABLET | Freq: Three times a day (TID) | ORAL | 0 refills | Status: DC | PRN

## 2021-08-24 MED ORDER — OXYCODONE-ACETAMINOPHEN 5-325 MG PO TABS
1.0000 | ORAL_TABLET | Freq: Once | ORAL | Status: AC
  Administered 2021-08-24: 1 via ORAL
  Filled 2021-08-24: qty 1

## 2021-08-24 MED ORDER — ONDANSETRON 4 MG PO TBDP
4.0000 mg | ORAL_TABLET | Freq: Once | ORAL | Status: AC
  Administered 2021-08-24: 4 mg via ORAL
  Filled 2021-08-24: qty 1

## 2021-08-24 MED ORDER — IBUPROFEN 800 MG PO TABS
800.0000 mg | ORAL_TABLET | Freq: Once | ORAL | Status: AC
  Administered 2021-08-24: 800 mg via ORAL
  Filled 2021-08-24: qty 1

## 2021-08-24 NOTE — Discharge Instructions (Addendum)

## 2021-08-24 NOTE — ED Notes (Signed)
Pt ambulatory with minimal assistance. 
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
## 2021-09-03 ENCOUNTER — Other Ambulatory Visit: Payer: Self-pay

## 2021-09-03 ENCOUNTER — Encounter (HOSPITAL_COMMUNITY): Payer: Self-pay | Admitting: Emergency Medicine

## 2021-09-03 ENCOUNTER — Emergency Department (HOSPITAL_COMMUNITY)
Admission: EM | Admit: 2021-09-03 | Discharge: 2021-09-03 | Disposition: A | Discharge status: Home / Self Care | Payer: Medicaid Other | Attending: Emergency Medicine | Admitting: Emergency Medicine

## 2021-09-03 DIAGNOSIS — R0781 Pleurodynia: Secondary | ICD-10-CM | POA: Diagnosis not present

## 2021-09-03 DIAGNOSIS — J45909 Unspecified asthma, uncomplicated: Secondary | ICD-10-CM | POA: Insufficient documentation

## 2021-09-03 MED ORDER — LIDOCAINE 5 % EX PTCH: 1.0000 | MEDICATED_PATCH | CUTANEOUS | 0 refills | Status: DC

## 2021-09-03 MED ORDER — METRONIDAZOLE 500 MG PO TABS: 500.0000 mg | ORAL_TABLET | Freq: Two times a day (BID) | ORAL | 0 refills | Status: DC

## 2021-09-03 MED ORDER — CYCLOBENZAPRINE HCL 10 MG PO TABS: 10.0000 mg | ORAL_TABLET | Freq: Two times a day (BID) | ORAL | 0 refills | Status: DC | PRN

## 2021-09-03 NOTE — Discharge Instructions (Signed)
Please use compression stockings every time you are on your feet for swelling.  Lidocaine patches for rib pain.  I gave you another prescription of metronidazole for trichomonas.  Please take it to its entirety.  Please follow-up with [facility] for further evaluation.  Please turn to the emergency department with any concerns you have including trouble breathing, fever, chills, worsening swelling of your legs.
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
## 2021-09-03 NOTE — Progress Notes (Signed)
Orthopedic Tech Progress Note Patient Details:  Lisa Hickman 1991/09/16 [id number]         [name] 09/03/2021, 8:37 PMcharge for finger splint supplies

## 2021-09-12 ENCOUNTER — Ambulatory Visit: Payer: Self-pay | Admitting: Internal Medicine

## 2021-12-04 ENCOUNTER — Ambulatory Visit: Payer: Self-pay | Admitting: Internal Medicine

## 2021-12-23 ENCOUNTER — Emergency Department (HOSPITAL_COMMUNITY): Payer: Medicaid Other

## 2021-12-23 ENCOUNTER — Emergency Department (HOSPITAL_COMMUNITY)
Admission: EM | Admit: 2021-12-23 | Discharge: 2021-12-23 | Disposition: A | Discharge status: Home / Self Care | Payer: Medicaid Other | Attending: Emergency Medicine | Admitting: Emergency Medicine

## 2021-12-23 DIAGNOSIS — R791 Abnormal coagulation profile: Secondary | ICD-10-CM | POA: Insufficient documentation

## 2021-12-23 DIAGNOSIS — S81811A Laceration without foreign body, right lower leg, initial encounter: Secondary | ICD-10-CM | POA: Insufficient documentation

## 2021-12-23 DIAGNOSIS — S8991XA Unspecified injury of right lower leg, initial encounter: Secondary | ICD-10-CM | POA: Diagnosis present

## 2021-12-23 DIAGNOSIS — T1490XA Injury, unspecified, initial encounter: Secondary | ICD-10-CM

## 2021-12-23 DIAGNOSIS — S81812A Laceration without foreign body, left lower leg, initial encounter: Secondary | ICD-10-CM

## 2021-12-23 DIAGNOSIS — Z23 Encounter for immunization: Secondary | ICD-10-CM | POA: Insufficient documentation

## 2021-12-23 LAB — COMPREHENSIVE METABOLIC PANEL
ALT: 19 U/L (ref 0–44)
AST: 26 U/L (ref 15–41)
Albumin: 3.9 g/dL (ref 3.5–5.0)
Alkaline Phosphatase: 77 U/L (ref 38–126)
Anion gap: 15 (ref 5–15)
BUN: 11 mg/dL (ref 6–20)
CO2: 18 mmol/L — ABNORMAL LOW (ref 22–32)
Calcium: 8.9 mg/dL (ref 8.9–10.3)
Chloride: 105 mmol/L (ref 98–111)
Creatinine, Ser: 1.18 mg/dL — ABNORMAL HIGH (ref 0.44–1.00)
GFR, Estimated: >60 mL/min (ref >60)
Glucose, Bld: 110 mg/dL — ABNORMAL HIGH (ref 70–99)
Potassium: 3.6 mmol/L (ref 3.5–5.1)
Sodium: 138 mmol/L (ref 135–145)
Total Bilirubin: 0.6 mg/dL (ref 0.3–1.2)
Total Protein: 7.7 g/dL (ref 6.5–8.1)

## 2021-12-23 LAB — I-STAT CHEM 8, ED
BUN: 10 mg/dL (ref 6–20)
Calcium, Ion: 1.11 mmol/L — ABNORMAL LOW (ref 1.15–1.40)
Chloride: 106 mmol/L (ref 98–111)
Creatinine, Ser: 1.3 mg/dL — ABNORMAL HIGH (ref 0.44–1.00)
Glucose, Bld: 105 mg/dL — ABNORMAL HIGH (ref 70–99)
HCT: 42 % (ref 36.0–46.0)
Hemoglobin: 14.3 g/dL (ref 12.0–15.0)
Potassium: 3.5 mmol/L (ref 3.5–5.1)
Sodium: 140 mmol/L (ref 135–145)
TCO2: 18 mmol/L — ABNORMAL LOW (ref 22–32)

## 2021-12-23 LAB — CBC
HCT: 39.6 % (ref 36.0–46.0)
Hemoglobin: 13.3 g/dL (ref 12.0–15.0)
MCH: 30.2 pg (ref 26.0–34.0)
MCHC: 33.6 g/dL (ref 30.0–36.0)
MCV: 89.8 fL (ref 80.0–100.0)
Platelets: 340 10*3/uL (ref 150–400)
RBC: 4.41 MIL/uL (ref 3.87–5.11)
RDW: 12.5 % (ref 11.5–15.5)
WBC: 15.5 10*3/uL — ABNORMAL HIGH (ref 4.0–10.5)
nRBC: 0 % (ref 0.0–0.2)

## 2021-12-23 LAB — I-STAT BETA HCG BLOOD, ED (MC, WL, AP ONLY): I-stat hCG, quantitative: <5 m[IU]/mL (ref <5)

## 2021-12-23 LAB — SAMPLE TO BLOOD BANK

## 2021-12-23 LAB — PROTIME-INR
INR: 1 (ref 0.8–1.2)
Prothrombin Time: 12.9 seconds (ref 11.4–15.2)

## 2021-12-23 LAB — LACTIC ACID, PLASMA
Lactic Acid, Venous: 5.2 mmol/L (ref 0.5–1.9)
Lactic Acid, Venous: 2 mmol/L (ref 0.5–1.9)

## 2021-12-23 LAB — ETHANOL: Alcohol, Ethyl (B): 139 mg/dL — ABNORMAL HIGH (ref <10)

## 2021-12-23 MED ORDER — LORAZEPAM 2 MG/ML IJ SOLN
INTRAMUSCULAR | Status: AC
  Administered 2021-12-23: 2 mg
  Filled 2021-12-23: qty 1

## 2021-12-23 MED ORDER — LIDOCAINE-EPINEPHRINE (PF) 2 %-1:200000 IJ SOLN
INTRAMUSCULAR | Status: AC
  Administered 2021-12-23: 20 mL
  Filled 2021-12-23: qty 20

## 2021-12-23 MED ORDER — SODIUM CHLORIDE 0.9 % IV BOLUS
1000.0000 mL | Freq: Once | INTRAVENOUS | Status: AC
  Administered 2021-12-23: 1000 mL via INTRAVENOUS

## 2021-12-23 MED ORDER — TETANUS-DIPHTH-ACELL PERTUSSIS 5-2.5-18.5 LF-MCG/0.5 IM SUSY
PREFILLED_SYRINGE | INTRAMUSCULAR | Status: AC
  Administered 2021-12-23: 0.5 mL via INTRAMUSCULAR
  Filled 2021-12-23: qty 0.5

## 2021-12-23 MED ORDER — CEFAZOLIN SODIUM-DEXTROSE 1-4 GM/50ML-% IV SOLN
1.0000 g | Freq: Once | INTRAVENOUS | Status: AC
  Administered 2021-12-23: 1 g via INTRAVENOUS
  Filled 2021-12-23: qty 50

## 2021-12-23 MED ORDER — OXYCODONE-ACETAMINOPHEN 5-325 MG PO TABS: 1.0000 | ORAL_TABLET | Freq: Four times a day (QID) | ORAL | 0 refills | Status: DC | PRN

## 2021-12-23 MED ORDER — CEPHALEXIN 500 MG PO CAPS: 500.0000 mg | ORAL_CAPSULE | Freq: Four times a day (QID) | ORAL | 0 refills | Status: DC
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
## 2021-12-23 NOTE — Discharge Instructions (Addendum)
Begin taking Keflex as prescribed.  Begin taking oxycodone as prescribed as needed for pain.  You may still take ibuprofen and some Tylenol though be warned that the oxycodone also has Tylenol in it and you cannot take more than 4000 mg/day.  Local wound care with bacitracin and dressing changes twice daily.  You are to follow-up with orthopedics in the next 3 to 4 days.  The contact information for Dr. Roda Shutters has been provided in this discharge summary for you to call and make these arrangements.  Use crutches and no weightbearing until followed up by orthopedics.

## 2021-12-23 NOTE — Progress Notes (Signed)
Orthopedic Tech Progress Note Patient Details:  [name] Mar 28, 1991 [id number]  Patient ID: [name], female   DOB: [birth year], 31 y.o.   MRN: [id number] Level II; not needed at the moment.  Vernona Rieger 12/23/2021, 3:48 AM

## 2021-12-23 NOTE — Progress Notes (Signed)
Orthopedic Tech Progress Note Patient Details:  Lisa Hickman November 07, 1991 [id number]  Ortho Devices Type of Ortho Device: Crutches Ortho Device/Splint Interventions: Adjustment   Post Interventions Instructions Provided: Poper ambulation with device  Lisa Hickman E Arturo Freundlich 12/23/2021, 9:27 AM

## 2021-12-23 NOTE — ED Notes (Signed)
Repaired laceration to RLE dressed, bacitracin applied

## 2021-12-23 NOTE — ED Notes (Signed)
Large laceration to RLE stapled by EDP, 29 staples in place, edges well approximated, no active bleeding.
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
## 2022-01-02 ENCOUNTER — Encounter: Payer: Self-pay | Admitting: Orthopaedic Surgery

## 2022-01-02 ENCOUNTER — Ambulatory Visit (INDEPENDENT_AMBULATORY_CARE_PROVIDER_SITE_OTHER): Payer: Medicaid Other | Admitting: Orthopaedic Surgery

## 2022-01-02 ENCOUNTER — Other Ambulatory Visit: Payer: Self-pay

## 2022-01-02 DIAGNOSIS — S81811A Laceration without foreign body, right lower leg, initial encounter: Secondary | ICD-10-CM

## 2022-01-02 MED ORDER — DICLOFENAC SODIUM 75 MG PO TBEC: 75.0000 mg | DELAYED_RELEASE_TABLET | Freq: Two times a day (BID) | ORAL | 2 refills | Status: DC
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
## 2022-04-06 ENCOUNTER — Ambulatory Visit: Payer: Medicaid Other | Admitting: Nurse Practitioner

## 2022-04-15 ENCOUNTER — Emergency Department (HOSPITAL_COMMUNITY)
Admission: EM | Admit: 2022-04-15 | Discharge: 2022-04-15 | Disposition: A | Discharge status: Home / Self Care | Payer: Medicaid Other | Attending: Emergency Medicine | Admitting: Emergency Medicine

## 2022-04-15 ENCOUNTER — Other Ambulatory Visit: Payer: Self-pay

## 2022-04-15 ENCOUNTER — Encounter (HOSPITAL_COMMUNITY): Payer: Self-pay

## 2022-04-15 DIAGNOSIS — G43909 Migraine, unspecified, not intractable, without status migrainosus: Secondary | ICD-10-CM | POA: Insufficient documentation

## 2022-04-15 DIAGNOSIS — R519 Headache, unspecified: Secondary | ICD-10-CM

## 2022-04-15 HISTORY — DX: Migraine, unspecified, not intractable, without status migrainosus: G43.909

## 2022-04-15 MED ORDER — EXCEDRIN MIGRAINE 250-250-65 MG PO TABS: 1.0000 | ORAL_TABLET | Freq: Four times a day (QID) | ORAL | 0 refills | Status: DC | PRN

## 2022-04-15 MED ORDER — DIPHENHYDRAMINE HCL 50 MG/ML IJ SOLN
12.5000 mg | Freq: Once | INTRAMUSCULAR | Status: AC
Start: 2022-04-15 — End: 2022-04-15
  Administered 2022-04-15: 12.5 mg via INTRAVENOUS
  Filled 2022-04-15: qty 1

## 2022-04-15 MED ORDER — METOCLOPRAMIDE HCL 5 MG/ML IJ SOLN
10.0000 mg | INTRAMUSCULAR | Status: AC
  Administered 2022-04-15: 10 mg via INTRAVENOUS
  Filled 2022-04-15: qty 2

## 2022-04-15 MED ORDER — SODIUM CHLORIDE 0.9 % IV BOLUS
1000.0000 mL | Freq: Once | INTRAVENOUS | Status: AC
  Administered 2022-04-15: 1000 mL via INTRAVENOUS

## 2022-04-15 NOTE — ED Notes (Signed)
I provided reinforced discharge education based off of discharge instructions. Pt acknowledged and understood my education. Pt had no further questions/concerns for provider/myself.  °

## 2022-04-15 NOTE — Discharge Instructions (Addendum)
Take the prescribed medication as directed if headache continues.  Make sure to rest. Follow-up with your primary care doctor. Return to the ED for new or worsening symptoms.

## 2022-04-15 NOTE — ED Provider Notes (Signed)
[facility] DEPT Provider Note   CSN: [id number] Arrival date & time: 04/15/22  0030     History  Chief Complaint  Patient presents with   Migraine    Lisa Hickman is a 31 y.o. female.  The history is provided by the patient and medical records.  Migraine Associated symptoms include headaches.   31 y.o. F presenting to the ED with migraine headache.  States it started on Tuesday and has remained persistent since that time.  States throbbing, pulsatile pain on right side of her head with some sharp, stabbing pains.  She reports nausea but denies vomiting.  No fever, chills, neck pain.  No focal numbness/weakness.  Reports headaches in the past but never this persistent.  Tried OTC motrin and aleve at home without relief.  Home Medications Prior to Admission medications   Medication Sig Start Date End Date Taking? Authorizing Provider  cephALEXin (KEFLEX) 500 MG capsule Take 1 capsule (500 mg total) by mouth 4 (four) times daily. 12/23/21   Veryl Speak, MD  cyclobenzaprine (FLEXERIL) 10 MG tablet Take 1 tablet (10 mg total) by mouth 2 (two) times daily as needed for muscle spasms. 09/03/21   Hendricks Limes, PA-C  diclofenac (VOLTAREN) 75 MG EC tablet Take 1 tablet (75 mg total) by mouth 2 (two) times daily. 01/02/22   Leandrew Koyanagi, MD  fluconazole (DIFLUCAN) 150 MG tablet Take 1 tablet (150 mg total) by mouth daily. 08/11/21   Francene Finders, PA-C  lidocaine (LIDODERM) 5 % Place 1 patch onto the skin daily. Remove & Discard patch within 12 hours or as directed by MD 09/03/21   Hendricks Limes, PA-C  meloxicam (MOBIC) 7.5 MG tablet Take 7.5 mg by mouth 2 (two) times daily. 02/19/22   [provider]  metroNIDAZOLE (FLAGYL) 500 MG tablet Take 1 tablet (500 mg total) by mouth 2 (two) times daily. 09/03/21   Hendricks Limes, PA-C  naproxen (NAPROSYN) 375 MG tablet Take 1 tablet (375 mg total) by mouth 2 (two) times daily with a meal.  08/24/21   Harris, Abigail, PA-C  nitrofurantoin, macrocrystal-monohydrate, (MACROBID) 100 MG capsule Take 1 capsule (100 mg total) by mouth 2 (two) times daily. 08/11/21   Francene Finders, PA-C  ondansetron (ZOFRAN) 4 MG tablet Take 1 tablet (4 mg total) by mouth every 8 (eight) hours as needed for nausea or vomiting. 08/24/21   Margarita Mail, PA-C  oxyCODONE-acetaminophen (PERCOCET) 5-325 MG tablet Take 1-2 tablets by mouth every 6 (six) hours as needed. 12/23/21   Veryl Speak, MD      Allergies    Otho Darner allergy], Food, and Vicodin [hydrocodone-acetaminophen]    Review of Systems   Review of Systems  Neurological:  Positive for headaches.  All other systems reviewed and are negative.  Physical Exam Updated Vital Signs BP 130/89 (BP Location: Right Arm)   Pulse 70   Temp 98 F (36.7 C) (Oral)   Resp 16   Ht 5\' 6"  (1.676 m)   Wt 97.5 kg   SpO2 95%   BMI 34.70 kg/m   Physical Exam Vitals and nursing note reviewed.  Constitutional:      General: She is not in acute distress.    Appearance: She is well-developed. She is not diaphoretic.     Comments: Wearing sunglasses, hands over eyes  HENT:     Head: Normocephalic and atraumatic.     Right Ear: External ear normal.  Left Ear: External ear normal.  Eyes:     Conjunctiva/sclera: Conjunctivae normal.     Pupils: Pupils are equal, round, and reactive to light.  Neck:     Comments: No rigidity, no meningismus Cardiovascular:     Rate and Rhythm: Normal rate and regular rhythm.     Heart sounds: Normal heart sounds. No murmur heard. Pulmonary:     Effort: Pulmonary effort is normal. No respiratory distress.     Breath sounds: Normal breath sounds. No wheezing or rhonchi.  Abdominal:     General: Bowel sounds are normal.     Palpations: Abdomen is soft.     Tenderness: There is no abdominal tenderness. There is no guarding or rebound.  Musculoskeletal:        General: Normal range of motion.     Cervical  back: Full passive range of motion without pain, normal range of motion and neck supple. No rigidity.  Skin:    General: Skin is warm and dry.     Findings: No rash.  Neurological:     Mental Status: She is alert and oriented to person, place, and time.     Cranial Nerves: No cranial nerve deficit.     Sensory: No sensory deficit.     Motor: No tremor or seizure activity.     Comments: AAOx3, answering questions and following commands appropriately; equal strength UE and LE bilaterally; CN grossly intact; moves all extremities appropriately without ataxia; no focal neuro deficits or facial asymmetry appreciated  Psychiatric:        Behavior: Behavior normal.        Thought Content: Thought content normal.    ED Results / Procedures / Treatments   Labs (all labs ordered are listed, but only abnormal results are displayed) Labs Reviewed - No data to display  EKG None  Radiology No results found.  Procedures Procedures    Medications Ordered in ED Medications  sodium chloride 0.9 % bolus 1,000 mL (has no administration in time range)  diphenhydrAMINE (BENADRYL) injection 12.5 mg (has no administration in time range)  metoCLOPramide (REGLAN) injection 10 mg (has no administration in time range)    ED Course/ Medical Decision Making/ A&P                           Medical Decision Making Risk OTC drugs. Prescription drug management.   31 year old female presenting to the ED with right-sided headache since Tuesday.  Pain pulsatile in nature with intermittent sharp, stabbing sensations.  She has had some nausea without vomiting.  She is awake, alert, oriented.  Does have apparent light sensitivity on exam.  She has no focal neurologic deficits.  No meningeal signs.  We will treat with migraine cocktail and reassess.  She is not on anticoagulation, no recent trauma.  With reassuring exam, do not feel she needs emergent head CT at this time.  Last SrCr on record was 1.3 in  February.  Will avoid toradol for now.  4:18 AM Patient resting comfortably with significant other.  Pain now 3/10.  Still having some photophobia but overall improved from prior.  Feels she can rest comfortably at home.  Stable for discharge.  We will have her continue medication as needed for headache.  Work note provided.  Can return here for new or acute changes.  Final Clinical Impression(s) / ED Diagnoses Final diagnoses:  Bad headache    Rx / DC Orders ED Discharge  Orders          Ordered    aspirin-acetaminophen-caffeine (EXCEDRIN MIGRAINE) [id number] MG tablet  Every 6 hours PRN        04/15/22 0419              Larene Pickett, PA-C 04/15/22 0428    Quintella Reichert, MD 04/15/22 [phone]

## 2022-04-15 NOTE — ED Triage Notes (Signed)
Patient has had a migraine since Tuesday. Has been throbbing on the right side. Has tried taking ibuprofen and aleve. No relief.

## 2022-04-25 ENCOUNTER — Ambulatory Visit: Payer: Medicaid Other | Admitting: Nurse Practitioner

## 2022-09-08 ENCOUNTER — Emergency Department (HOSPITAL_COMMUNITY): Payer: Medicaid Other

## 2022-09-08 ENCOUNTER — Encounter (HOSPITAL_COMMUNITY): Payer: Self-pay

## 2022-09-08 ENCOUNTER — Emergency Department (HOSPITAL_COMMUNITY)
Admission: EM | Admit: 2022-09-08 | Discharge: 2022-09-08 | Disposition: A | Discharge status: Home / Self Care | Payer: Medicaid Other | Attending: Emergency Medicine | Admitting: Emergency Medicine

## 2022-09-08 ENCOUNTER — Other Ambulatory Visit: Payer: Self-pay

## 2022-09-08 DIAGNOSIS — J45909 Unspecified asthma, uncomplicated: Secondary | ICD-10-CM | POA: Diagnosis not present

## 2022-09-08 DIAGNOSIS — S0512XA Contusion of eyeball and orbital tissues, left eye, initial encounter: Secondary | ICD-10-CM | POA: Insufficient documentation

## 2022-09-08 DIAGNOSIS — S6991XA Unspecified injury of right wrist, hand and finger(s), initial encounter: Secondary | ICD-10-CM | POA: Diagnosis not present

## 2022-09-08 DIAGNOSIS — S0592XA Unspecified injury of left eye and orbit, initial encounter: Secondary | ICD-10-CM | POA: Diagnosis present

## 2022-09-08 MED ORDER — NAPROXEN 500 MG PO TABS
500.0000 mg | ORAL_TABLET | Freq: Once | ORAL | Status: AC
  Administered 2022-09-08: 500 mg via ORAL
  Filled 2022-09-08: qty 1

## 2022-09-08 MED ORDER — NAPROXEN 375 MG PO TABS: ORAL_TABLET | ORAL | 0 refills | Status: DC
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
## 2022-09-08 NOTE — Discharge Instructions (Signed)
Please wear your splint until you follow-up with your hand surgeon.  This is to immobilize the scaphoid bone in your right wrist.  Sometimes this bone can be fractured and not show up on x-ray.  This is to protect that bone until your hand surgeon can reevaluate you.
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
## 2022-09-16 ENCOUNTER — Encounter (HOSPITAL_COMMUNITY): Payer: Self-pay | Admitting: Emergency Medicine

## 2022-09-16 ENCOUNTER — Emergency Department (HOSPITAL_COMMUNITY)
Admission: EM | Admit: 2022-09-16 | Discharge: 2022-09-16 | Disposition: A | Discharge status: Home / Self Care | Payer: Medicaid Other | Attending: Emergency Medicine | Admitting: Emergency Medicine

## 2022-09-16 ENCOUNTER — Emergency Department (HOSPITAL_COMMUNITY): Payer: Medicaid Other

## 2022-09-16 ENCOUNTER — Other Ambulatory Visit: Payer: Self-pay

## 2022-09-16 DIAGNOSIS — Y92810 Car as the place of occurrence of the external cause: Secondary | ICD-10-CM | POA: Insufficient documentation

## 2022-09-16 DIAGNOSIS — M545 Low back pain, unspecified: Secondary | ICD-10-CM | POA: Diagnosis not present

## 2022-09-16 DIAGNOSIS — H1131 Conjunctival hemorrhage, right eye: Secondary | ICD-10-CM

## 2022-09-16 DIAGNOSIS — Z7982 Long term (current) use of aspirin: Secondary | ICD-10-CM | POA: Insufficient documentation

## 2022-09-16 DIAGNOSIS — S21001A Unspecified open wound of right breast, initial encounter: Secondary | ICD-10-CM | POA: Insufficient documentation

## 2022-09-16 DIAGNOSIS — S40022A Contusion of left upper arm, initial encounter: Secondary | ICD-10-CM | POA: Insufficient documentation

## 2022-09-16 DIAGNOSIS — H5711 Ocular pain, right eye: Secondary | ICD-10-CM | POA: Diagnosis present

## 2022-09-16 DIAGNOSIS — S0501XA Injury of conjunctiva and corneal abrasion without foreign body, right eye, initial encounter: Secondary | ICD-10-CM | POA: Insufficient documentation

## 2022-09-16 DIAGNOSIS — S0502XA Injury of conjunctiva and corneal abrasion without foreign body, left eye, initial encounter: Secondary | ICD-10-CM | POA: Insufficient documentation

## 2022-09-16 DIAGNOSIS — S0500XA Injury of conjunctiva and corneal abrasion without foreign body, unspecified eye, initial encounter: Secondary | ICD-10-CM

## 2022-09-16 DIAGNOSIS — S0511XA Contusion of eyeball and orbital tissues, right eye, initial encounter: Secondary | ICD-10-CM | POA: Diagnosis not present

## 2022-09-16 DIAGNOSIS — W3400XA Accidental discharge from unspecified firearms or gun, initial encounter: Secondary | ICD-10-CM | POA: Insufficient documentation

## 2022-09-16 MED ORDER — LIDOCAINE-EPINEPHRINE-TETRACAINE (LET) TOPICAL GEL
3.0000 mL | Freq: Once | TOPICAL | Status: DC
  Filled 2022-09-16: qty 3

## 2022-09-16 MED ORDER — POLYMYXIN B-TRIMETHOPRIM 10000-0.1 UNIT/ML-% OP SOLN: 1.0000 [drp] | OPHTHALMIC | 0 refills | Status: DC

## 2022-09-16 MED ORDER — FLUORESCEIN SODIUM 1 MG OP STRP
ORAL_STRIP | OPHTHALMIC | Status: AC
  Filled 2022-09-16: qty 1

## 2022-09-16 MED ORDER — BACITRACIN ZINC 500 UNIT/GM EX OINT: TOPICAL_OINTMENT | Freq: Two times a day (BID) | CUTANEOUS | Status: DC
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
## 2022-09-16 NOTE — Discharge Instructions (Signed)
I would apply an ointment to your wound to your breast a couple times a day.  Please return for redness drainage or if you develop a fever.  I do not see any obvious signs that you had a foreign body to either of your eyes.  You do have signs of injury to the outside layer of the eye and you have some bleeding into the white part of your eye.  Please call the ophthalmologists and schedule an appointment.  They can take a closer look at your eyes and make sure that you have no other issues.

## 2022-09-25 ENCOUNTER — Ambulatory Visit: Payer: Medicaid Other | Admitting: Obstetrics & Gynecology

## 2022-10-23 ENCOUNTER — Ambulatory Visit (INDEPENDENT_AMBULATORY_CARE_PROVIDER_SITE_OTHER): Payer: Medicaid Other | Admitting: Obstetrics & Gynecology

## 2022-10-23 ENCOUNTER — Other Ambulatory Visit (HOSPITAL_COMMUNITY)
Admission: RE | Admit: 2022-10-23 | Discharge: 2022-10-23 | Disposition: A | Discharge status: Home / Self Care | Payer: Medicaid Other | Source: Ambulatory Visit | Attending: Obstetrics & Gynecology | Admitting: Obstetrics & Gynecology

## 2022-10-23 ENCOUNTER — Encounter: Payer: Self-pay | Admitting: Obstetrics & Gynecology

## 2022-10-23 ENCOUNTER — Other Ambulatory Visit (HOSPITAL_COMMUNITY)
Admission: RE | Admit: 2022-10-23 | Discharge: 2022-10-23 | Disposition: A | Discharge status: Home / Self Care | Payer: Medicaid Other | Source: Ambulatory Visit

## 2022-10-23 VITALS — Ht 67.0 in | Wt 270.0 lb

## 2022-10-23 DIAGNOSIS — Z8619 Personal history of other infectious and parasitic diseases: Secondary | ICD-10-CM | POA: Diagnosis not present

## 2022-10-23 DIAGNOSIS — R87810 Cervical high risk human papillomavirus (HPV) DNA test positive: Secondary | ICD-10-CM | POA: Insufficient documentation

## 2022-10-23 DIAGNOSIS — R234 Changes in skin texture: Secondary | ICD-10-CM

## 2022-10-23 DIAGNOSIS — R87610 Atypical squamous cells of undetermined significance on cytologic smear of cervix (ASC-US): Secondary | ICD-10-CM | POA: Diagnosis not present

## 2022-10-23 NOTE — Progress Notes (Signed)
   Established Patient Office Visit  Subjective   Patient ID: Lisa Hickman, female    DOB: 1991/03/21  Age: 31 y.o. MRN: [id number]  Chief Complaint  Patient presents with   Colposcopy    HPI   31 yo single P2 here for a colpo due to a ASCUS + HR HPV pap. She had a LGSIL pap in her records in 2017.   She also was noted to have trich last year and I don't see a TOC in her chart. She uses BTL as contraception.   Objective:     Ht 5\' 7"  (1.702 m)   Wt 270 lb (122.5 kg)   BMI 42.29 kg/m    Physical Exam   Well nourished, well hydrated Black female, no apparent distress She is ambulating and conversing normally. UPT negative, consent signed, time out done Cervix prepped with acetic acid. Transformation zone seen in its entirety. Colpo adequate. Acetowhite area on the anterior lip (basically most of the anterior lip), posterior lip NED I obtained a biopsy at the 11 o'clock position and used silver nitrate to achieve hemostasis. ECC obtained. She tolerated the procedure well.  Assessment & Plan:   Problem List Items Addressed This Visit   None Visit Diagnoses     ASCUS with positive high risk HPV cervical    -  Primary   Relevant Orders   Colposcopy   History of trichomoniasis       Relevant Orders   Cervicovaginal ancillary only      I explained that if she needs treatment, it would likely be either a cryotherapy versus LEEP, depending on the pathology results. I will plan on calling her next Monday with the results and treatment plan.    Monday, MD

## 2022-10-24 LAB — CERVICOVAGINAL ANCILLARY ONLY
Bacterial Vaginitis (gardnerella): POSITIVE — AB
Candida Glabrata: NEGATIVE
Candida Vaginitis: NEGATIVE
Chlamydia: NEGATIVE
Comment: NEGATIVE
Comment: NEGATIVE
Comment: NEGATIVE
Comment: NEGATIVE
Comment: NEGATIVE
Comment: NORMAL
Neisseria Gonorrhea: NEGATIVE
Trichomonas: NEGATIVE

## 2022-10-24 LAB — SURGICAL PATHOLOGY

## 2022-10-29 ENCOUNTER — Other Ambulatory Visit: Payer: Self-pay | Admitting: Obstetrics & Gynecology

## 2022-10-29 ENCOUNTER — Telehealth: Payer: Self-pay | Admitting: Obstetrics & Gynecology

## 2022-10-29 MED ORDER — METRONIDAZOLE 500 MG PO TABS: 500.0000 mg | ORAL_TABLET | Freq: Two times a day (BID) | ORAL | 0 refills | Status: DC

## 2022-10-29 NOTE — Progress Notes (Signed)
Flagyl prescribed to treat BV seen on Aptima.

## 2022-10-29 NOTE — Telephone Encounter (Signed)
Called with her results. No answer

## 2023-05-16 ENCOUNTER — Emergency Department (HOSPITAL_COMMUNITY): Payer: Medicaid Other

## 2023-05-16 ENCOUNTER — Other Ambulatory Visit: Payer: Self-pay

## 2023-05-16 ENCOUNTER — Encounter (HOSPITAL_COMMUNITY): Payer: Self-pay

## 2023-05-16 ENCOUNTER — Emergency Department (HOSPITAL_COMMUNITY)
Admission: EM | Admit: 2023-05-16 | Discharge: 2023-05-16 | Disposition: A | Discharge status: Home / Self Care | Payer: Medicaid Other | Attending: Emergency Medicine | Admitting: Emergency Medicine

## 2023-05-16 DIAGNOSIS — M79602 Pain in left arm: Secondary | ICD-10-CM | POA: Diagnosis not present

## 2023-05-16 DIAGNOSIS — M79605 Pain in left leg: Secondary | ICD-10-CM | POA: Insufficient documentation

## 2023-05-16 MED ORDER — IBUPROFEN 800 MG PO TABS: 800.0000 mg | ORAL_TABLET | Freq: Once | ORAL | Status: DC
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
## 2023-05-16 NOTE — ED Notes (Signed)
Pt provided discharge instructions and prescription information. Pt was given the opportunity to ask questions and questions were answered.   

## 2023-05-16 NOTE — ED Notes (Signed)
Shoulder immobilizer was applied to pt and instructions were given .
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
## 2023-05-16 NOTE — ED Notes (Signed)
Went into pt's room to ask her what she would like to drink with her Ibuprofen. Pt states " what is Ibuprofen going to do for me?" This Clinical research associate educated pt on the medication helping with inflammation and pain. Pt states ' I don't want no Ibuprofen."

## 2023-07-18 ENCOUNTER — Telehealth: Payer: Medicaid Other | Admitting: Physician Assistant

## 2023-07-18 DIAGNOSIS — B3731 Acute candidiasis of vulva and vagina: Secondary | ICD-10-CM | POA: Diagnosis not present

## 2023-07-18 MED ORDER — FLUCONAZOLE 150 MG PO TABS
150.0000 mg | ORAL_TABLET | ORAL | 0 refills | Status: DC | PRN
Start: 2023-07-18 — End: 2023-09-09

## 2023-07-18 NOTE — Progress Notes (Signed)
Virtual Visit Consent   Lisa Hickman, you are scheduled for a virtual visit with a [facility] provider today. Just as with appointments in the office, your consent must be obtained to participate. Your consent will be active for this visit and any virtual visit you may have with one of our providers in the next 365 days. If you have a MyChart account, a copy of this consent can be sent to you electronically.  As this is a virtual visit, video technology does not allow for your provider to perform a traditional examination. This may limit your provider's ability to fully assess your condition. If your provider identifies any concerns that need to be evaluated in person or the need to arrange testing (such as labs, EKG, etc.), we will make arrangements to do so. Although advances in technology are sophisticated, we cannot ensure that it will always work on either your end or our end. If the connection with a video visit is poor, the visit may have to be switched to a telephone visit. With either a video or telephone visit, we are not always able to ensure that we have a secure connection.  By engaging in this virtual visit, you consent to the provision of healthcare and authorize for your insurance to be billed (if applicable) for the services provided during this visit. Depending on your insurance coverage, you may receive a charge related to this service.  I need to obtain your verbal consent now. Are you willing to proceed with your visit today? Arian Yunk has provided verbal consent on 07/18/2023 for a virtual visit (video or telephone). [name], PA-C  Date: 07/18/2023 12:18 PM  Virtual Visit via Video Note   I, [name], connected with  Lisa Hickman  ([id number], 1991-03-27) on 07/18/23 at 12:15 PM EDT by a video-enabled telemedicine application and verified that I am speaking with the correct person using two identifiers.  Location: Patient:  Virtual Visit Location Patient: Mobile Provider: Virtual Visit Location Provider: Home Office   I discussed the limitations of evaluation and management by telemedicine and the availability of in person appointments. The patient expressed understanding and agreed to proceed.    History of Present Illness: Lisa Hickman is a 32 y.o. who identifies as a female who was assigned female at birth, and is being seen today for possible yeast infection.  HPI: Vaginal Itching The patient's primary symptoms include genital itching and vaginal discharge. The patient's pertinent negatives include no genital lesions or genital odor. This is a recurrent problem. The current episode started yesterday. The problem occurs constantly. The problem has been gradually worsening. The patient is experiencing no pain. She is not pregnant. Pertinent negatives include no abdominal pain, back pain, chills, constipation, diarrhea, discolored urine, dysuria, fever, frequency, headaches, hematuria, nausea, painful intercourse, urgency or vomiting. The vaginal discharge was white and thick. There has been no bleeding. She has not been passing clots. She has not been passing tissue. The symptoms are aggravated by tactile pressure. She has tried nothing for the symptoms. The treatment provided no relief. No, her partner does not have an STD.     Problems:  Patient Active Problem List   Diagnosis Date Noted   Laceration of right lower leg 01/02/2022   Encounter for sterilization    Post-dates pregnancy 12/10/2016   Abnormal cervical Papanicolaou smear affecting pregnancy, antepartum 07/09/2016   Supervision of normal pregnancy, antepartum 06/25/2016   Drug use affecting pregnancy, antepartum 06/25/2016  Allergies:  Allergies  Allergen Reactions   Crab [Shellfish Allergy]    Vicodin [Hydrocodone-Acetaminophen] Nausea And Vomiting   Medications:  Current Outpatient Medications:    fluconazole (DIFLUCAN) 150 MG  tablet, Take 1 tablet (150 mg total) by mouth every 3 (three) days as needed., Disp: 2 tablet, Rfl: 0   metroNIDAZOLE (FLAGYL) 500 MG tablet, Take 1 tablet (500 mg total) by mouth 2 (two) times daily., Disp: 14 tablet, Rfl: 0  Observations/Objective: Patient is well-developed, well-nourished in no acute distress.  Resting comfortably Head is normocephalic, atraumatic.  No labored breathing.  Speech is clear and coherent with logical content.  Patient is alert and oriented at baseline.   Assessment and Plan: 1. Yeast vaginitis - fluconazole (DIFLUCAN) 150 MG tablet; Take 1 tablet (150 mg total) by mouth every 3 (three) days as needed.  Dispense: 2 tablet; Refill: 0  - Symptoms consistent with yeast vaginitis - Fluconazole prescribed - Limit bubble baths, scented lotions/soaps/detergents - Limit tight fitting clothing - Seek on person evaluation if not improving or if symptoms worsen    Follow Up Instructions: I discussed the assessment and treatment plan with the patient. The patient was provided an opportunity to ask questions and all were answered. The patient agreed with the plan and demonstrated an understanding of the instructions.  A copy of instructions were sent to the patient via MyChart unless otherwise noted below.    The patient was advised to call back or seek an in-person evaluation if the symptoms worsen or if the condition fails to improve as anticipated.  Time:  I spent 8 minutes with the patient via telehealth technology discussing the above problems/concerns.    [name], PA-C

## 2023-07-18 NOTE — Patient Instructions (Signed)
Lisa Hickman, thank you for joining [name], PA-C for today's virtual visit.  While this provider is not your primary care provider (PCP), if your PCP is located in our provider database this encounter information will be shared with them immediately following your visit.   A [facility] MyChart account gives you access to today's visit and all your visits, tests, and labs performed at [facility] " click here if you don't have a [facility] MyChart account or go to [email or web address]  Consent: (Patient) Lisa Hickman provided verbal consent for this virtual visit at the beginning of the encounter.  Current Medications:  Current Outpatient Medications:    fluconazole (DIFLUCAN) 150 MG tablet, Take 1 tablet (150 mg total) by mouth every 3 (three) days as needed., Disp: 2 tablet, Rfl: 0   metroNIDAZOLE (FLAGYL) 500 MG tablet, Take 1 tablet (500 mg total) by mouth 2 (two) times daily., Disp: 14 tablet, Rfl: 0   Medications ordered in this encounter:  Meds ordered this encounter  Medications   fluconazole (DIFLUCAN) 150 MG tablet    Sig: Take 1 tablet (150 mg total) by mouth every 3 (three) days as needed.    Dispense:  2 tablet    Refill:  0    Order Specific Question:   Supervising Provider    Answer:   Merrilee Jansky [id number]     *If you need refills on other medications prior to your next appointment, please contact your pharmacy*  Follow-Up: Call back or seek an in-person evaluation if the symptoms worsen or if the condition fails to improve as anticipated.  [facility] [phone]  Other Instructions Vaginal Yeast Infection, Adult  Vaginal yeast infection is a condition that causes vaginal discharge as well as soreness, swelling, and redness (inflammation) of the vagina. This is a common condition. Some women get this infection frequently. What are the causes? This condition is caused by a change in the  normal balance of the yeast (Candida) and normal bacteria that live in the vagina. This change causes an overgrowth of yeast, which causes the inflammation. What increases the risk? The condition is more likely to develop in women who: Take antibiotic medicines. Have diabetes. Take birth control pills. Are pregnant. Douche often. Have a weak body defense system (immune system). Have been taking steroid medicines for a long time. Frequently wear tight clothing. What are the signs or symptoms? Symptoms of this condition include: White, thick, creamy vaginal discharge. Swelling, itching, redness, and irritation of the vagina. The lips of the vagina (labia) may be affected as well. Pain or a burning feeling while urinating. Pain during sex. How is this diagnosed? This condition is diagnosed based on: Your medical history. A physical exam. A pelvic exam. Your health care provider will examine a sample of your vaginal discharge under a microscope. Your health care provider may send this sample for testing to confirm the diagnosis. How is this treated? This condition is treated with medicine. Medicines may be over-the-counter or prescription. You may be told to use one or more of the following: Medicine that is taken by mouth (orally). Medicine that is applied as a cream (topically). Medicine that is inserted directly into the vagina (suppository). Follow these instructions at home: Take or apply over-the-counter and prescription medicines only as told by your health care provider. Do not use tampons until your health care provider approves. Do not have sex until your infection has cleared. Sex can prolong or  worsen your symptoms of infection. Ask your health care provider when it is safe to resume sexual activity. Keep all follow-up visits. This is important. How is this prevented?  Do not wear tight clothes, such as pantyhose or tight pants. Wear breathable cotton underwear. Do not use  douches, perfumed soap, creams, or powders. Wipe from front to back after using the toilet. If you have diabetes, keep your blood sugar levels under control. Ask your health care provider for other ways to prevent yeast infections. Contact a health care provider if: You have a fever. Your symptoms go away and then return. Your symptoms do not get better with treatment. Your symptoms get worse. You have new symptoms. You develop blisters in or around your vagina. You have blood coming from your vagina and it is not your menstrual period. You develop pain in your abdomen. Summary Vaginal yeast infection is a condition that causes discharge as well as soreness, swelling, and redness (inflammation) of the vagina. This condition is treated with medicine. Medicines may be over-the-counter or prescription. Take or apply over-the-counter and prescription medicines only as told by your health care provider. Do not douche. Resume sexual activity or use of tampons as instructed by your health care provider. Contact a health care provider if your symptoms do not get better with treatment or your symptoms go away and then return. This information is not intended to replace advice given to you by your health care provider. Make sure you discuss any questions you have with your health care provider. Document Revised: 01/16/2021 Document Reviewed: 01/16/2021 Elsevier Patient Education  2024 Elsevier Inc.    If you have been instructed to have an in-person evaluation today at a local Urgent Care facility, please use the link below. It will take you to a list of all of our available [facility], including address, phone number and hours of operation. Please do not delay care.  [facility]  If you or a family member do not have a primary care provider, use the link below to schedule a visit and establish care. When you choose a [facility] primary care physician or advanced  practice provider, you gain a long-term partner in health. Find a Primary Care Provider  Learn more about [facility]'s in-office and virtual care options: [facility] - Get Care Now

## 2023-09-09 ENCOUNTER — Ambulatory Visit (HOSPITAL_COMMUNITY)
Admission: EM | Admit: 2023-09-09 | Discharge: 2023-09-09 | Disposition: A | Discharge status: Home / Self Care | Payer: Medicaid Other | Attending: Family Medicine | Admitting: Family Medicine

## 2023-09-09 ENCOUNTER — Encounter (HOSPITAL_COMMUNITY): Payer: Self-pay | Admitting: *Deleted

## 2023-09-09 DIAGNOSIS — K047 Periapical abscess without sinus: Secondary | ICD-10-CM | POA: Diagnosis not present

## 2023-09-09 MED ORDER — KETOROLAC TROMETHAMINE 30 MG/ML IJ SOLN
30.0000 mg | Freq: Once | INTRAMUSCULAR | Status: AC
  Administered 2023-09-09: 30 mg via INTRAMUSCULAR

## 2023-09-09 MED ORDER — AMOXICILLIN-POT CLAVULANATE 875-125 MG PO TABS: 1.0000 | ORAL_TABLET | Freq: Two times a day (BID) | ORAL | 0 refills | Status: AC

## 2023-09-09 MED ORDER — KETOROLAC TROMETHAMINE 10 MG PO TABS: 10.0000 mg | ORAL_TABLET | Freq: Four times a day (QID) | ORAL | 0 refills | Status: DC | PRN

## 2023-09-09 MED ORDER — KETOROLAC TROMETHAMINE 30 MG/ML IJ SOLN
INTRAMUSCULAR | Status: AC
  Filled 2023-09-09: qty 1

## 2023-09-09 NOTE — ED Triage Notes (Signed)
Pt states she has had left upper dental pain x 2.5 weeks. She states the pain is causing headache and ear pain. She has been taking tylenol, IBU. She does have a dentist appt in a couple days.

## 2023-09-09 NOTE — Discharge Instructions (Signed)
You have been given a shot of Toradol 30 mg today.  Ketorolac 10 mg tablets--take 1 tablet every 6 hours as needed for pain.  This is the same medicine that is in the shot we just gave you  Take amoxicillin-clavulanate 875 mg--1 tab twice daily with food for 7 days; this is the antibiotic.
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
## 2023-09-18 ENCOUNTER — Encounter (HOSPITAL_COMMUNITY): Payer: Self-pay

## 2023-09-18 ENCOUNTER — Ambulatory Visit (HOSPITAL_COMMUNITY)
Admission: EM | Admit: 2023-09-18 | Discharge: 2023-09-18 | Disposition: A | Discharge status: Home / Self Care | Payer: Medicaid Other | Attending: Sports Medicine | Admitting: Sports Medicine

## 2023-09-18 DIAGNOSIS — K0889 Other specified disorders of teeth and supporting structures: Secondary | ICD-10-CM | POA: Diagnosis not present

## 2023-09-18 DIAGNOSIS — K047 Periapical abscess without sinus: Secondary | ICD-10-CM

## 2023-09-18 DIAGNOSIS — R030 Elevated blood-pressure reading, without diagnosis of hypertension: Secondary | ICD-10-CM

## 2023-09-18 MED ORDER — AMOXICILLIN-POT CLAVULANATE 875-125 MG PO TABS
1.0000 | ORAL_TABLET | Freq: Two times a day (BID) | ORAL | 0 refills | Status: AC
Start: 2023-09-18 — End: ?

## 2023-09-18 MED ORDER — FLUCONAZOLE 150 MG PO TABS: 150.0000 mg | ORAL_TABLET | ORAL | 0 refills | Status: AC

## 2023-09-18 MED ORDER — LIDOCAINE VISCOUS HCL 2 % MT SOLN
15.0000 mL | OROMUCOSAL | 0 refills | Status: AC | PRN
Start: 2023-09-18 — End: ?

## 2023-09-18 MED ORDER — KETOROLAC TROMETHAMINE 10 MG PO TABS: 10.0000 mg | ORAL_TABLET | Freq: Four times a day (QID) | ORAL | 0 refills | Status: AC | PRN

## 2023-09-18 NOTE — Discharge Instructions (Signed)
Start Augmentin twice daily for 7 days.  Use ketorolac every 6 hours as needed.  You should avoid NSAIDs including aspirin, ibuprofen/Advil, naproxen/Aleve with this medication as it causes stomach bleeding.  You can use Tylenol for breakthrough pain.  Gargle with warm salt water for additional symptom relief.  Use viscous lidocaine up to 6 times a day.  Do not eat or drink immediately after using this medication as it can increase your risk for choking.  You should follow-up with dentist; call to schedule an appointment.  If you develop any worsening symptoms including difficulty swallowing, difficulty speaking, swelling of your throat, high fever, change in your voice you need to be seen immediately.
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
## 2023-09-18 NOTE — ED Triage Notes (Signed)
Pt is here for dental pain and swelling. Pt states he medication was flush down the toilet. Pt is taking Ibuprofen with no relief.

## 2023-11-24 ENCOUNTER — Encounter (HOSPITAL_COMMUNITY): Payer: Self-pay

## 2023-11-24 ENCOUNTER — Other Ambulatory Visit: Payer: Self-pay

## 2023-11-24 ENCOUNTER — Emergency Department (HOSPITAL_COMMUNITY): Payer: No Typology Code available for payment source

## 2023-11-24 ENCOUNTER — Emergency Department (HOSPITAL_COMMUNITY)
Admission: EM | Admit: 2023-11-24 | Discharge: 2023-11-24 | Disposition: A | Discharge status: Home / Self Care | Payer: No Typology Code available for payment source | Attending: Emergency Medicine | Admitting: Emergency Medicine

## 2023-11-24 DIAGNOSIS — M25551 Pain in right hip: Secondary | ICD-10-CM | POA: Insufficient documentation

## 2023-11-24 DIAGNOSIS — M25562 Pain in left knee: Secondary | ICD-10-CM | POA: Diagnosis not present

## 2023-11-24 DIAGNOSIS — M79605 Pain in left leg: Secondary | ICD-10-CM | POA: Diagnosis not present

## 2023-11-24 DIAGNOSIS — R079 Chest pain, unspecified: Secondary | ICD-10-CM | POA: Insufficient documentation

## 2023-11-24 DIAGNOSIS — Y92410 Unspecified street and highway as the place of occurrence of the external cause: Secondary | ICD-10-CM | POA: Diagnosis not present

## 2023-11-24 DIAGNOSIS — M79604 Pain in right leg: Secondary | ICD-10-CM | POA: Insufficient documentation

## 2023-11-24 DIAGNOSIS — M545 Low back pain, unspecified: Secondary | ICD-10-CM | POA: Insufficient documentation

## 2023-11-24 DIAGNOSIS — S0990XA Unspecified injury of head, initial encounter: Secondary | ICD-10-CM | POA: Diagnosis present

## 2023-11-24 LAB — CBC WITH DIFFERENTIAL/PLATELET
Abs Immature Granulocytes: 0.01 10*3/uL (ref 0.00–0.07)
Basophils Absolute: 0 10*3/uL (ref 0.0–0.1)
Basophils Relative: 0 %
Eosinophils Absolute: 0 10*3/uL (ref 0.0–0.5)
Eosinophils Relative: 0 %
HCT: 37.5 % (ref 36.0–46.0)
Hemoglobin: 12.4 g/dL (ref 12.0–15.0)
Immature Granulocytes: 0 %
Lymphocytes Relative: 25 %
Lymphs Abs: 2.5 10*3/uL (ref 0.7–4.0)
MCH: 29.1 pg (ref 26.0–34.0)
MCHC: 33.1 g/dL (ref 30.0–36.0)
MCV: 88 fL (ref 80.0–100.0)
Monocytes Absolute: 0.6 10*3/uL (ref 0.1–1.0)
Monocytes Relative: 6 %
Neutro Abs: 6.7 10*3/uL (ref 1.7–7.7)
Neutrophils Relative %: 69 %
Platelets: 329 10*3/uL (ref 150–400)
RBC: 4.26 MIL/uL (ref 3.87–5.11)
RDW: 13 % (ref 11.5–15.5)
WBC: 9.8 10*3/uL (ref 4.0–10.5)
nRBC: 0 % (ref 0.0–0.2)

## 2023-11-24 LAB — COMPREHENSIVE METABOLIC PANEL
ALT: 19 U/L (ref 0–44)
AST: 19 U/L (ref 15–41)
Albumin: 3.5 g/dL (ref 3.5–5.0)
Alkaline Phosphatase: 86 U/L (ref 38–126)
Anion gap: 3 — ABNORMAL LOW (ref 5–15)
BUN: 13 mg/dL (ref 6–20)
CO2: 25 mmol/L (ref 22–32)
Calcium: 8.5 mg/dL — ABNORMAL LOW (ref 8.9–10.3)
Chloride: 109 mmol/L (ref 98–111)
Creatinine, Ser: 0.81 mg/dL (ref 0.44–1.00)
GFR, Estimated: >60 mL/min (ref >60)
Glucose, Bld: 78 mg/dL (ref 70–99)
Potassium: 3.8 mmol/L (ref 3.5–5.1)
Sodium: 137 mmol/L (ref 135–145)
Total Bilirubin: 0.4 mg/dL (ref 0.0–1.2)
Total Protein: 7.3 g/dL (ref 6.5–8.1)

## 2023-11-24 LAB — TROPONIN I (HIGH SENSITIVITY): Troponin I (High Sensitivity): <2 ng/L (ref <18)

## 2023-11-24 MED ORDER — ACETAMINOPHEN 500 MG PO TABS
1000.0000 mg | ORAL_TABLET | Freq: Once | ORAL | Status: AC
  Administered 2023-11-24: 1000 mg via ORAL
  Filled 2023-11-24: qty 2

## 2023-11-24 MED ORDER — CYCLOBENZAPRINE HCL 5 MG PO TABS: 5.0000 mg | ORAL_TABLET | Freq: Three times a day (TID) | ORAL | 0 refills | Status: AC | PRN

## 2023-11-24 MED ORDER — IBUPROFEN 800 MG PO TABS
800.0000 mg | ORAL_TABLET | Freq: Once | ORAL | Status: DC
  Filled 2023-11-24: qty 1

## 2023-11-24 MED ORDER — CYCLOBENZAPRINE HCL 10 MG PO TABS
5.0000 mg | ORAL_TABLET | Freq: Once | ORAL | Status: AC
  Administered 2023-11-24: 5 mg via ORAL
  Filled 2023-11-24: qty 1
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
## 2023-11-24 NOTE — ED Notes (Signed)
 Pt not in room for 10 min now.  in x-ray
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
## 2023-11-24 NOTE — Discharge Instructions (Signed)
 Your x-rays and CAT scans were reassuring today.  There is no evidence of any kind of fractures, or lab abnormalities.  This is reassuring.  I believe that you have just bad contusions, which is causing your pain.  He should take Tylenol  and ibuprofen  for the pain control.  I have also sent you a few muscle relaxers, to help with the pain.  And giving you some time off.  Return to the ER if you have intractable nausea, vomiting, bruising of the abdomen, lightheadedness, or dizziness

## 2023-12-19 ENCOUNTER — Ambulatory Visit: Payer: Medicaid Other | Admitting: Family Medicine

## 2024-02-27 ENCOUNTER — Ambulatory Visit: Payer: Medicaid Other | Admitting: Family Medicine

## 2024-05-26 ENCOUNTER — Emergency Department (HOSPITAL_COMMUNITY)
Admission: EM | Admit: 2024-05-26 | Discharge: 2024-05-27 | Disposition: A | Discharge status: Home / Self Care | Attending: Emergency Medicine | Admitting: Emergency Medicine

## 2024-05-26 ENCOUNTER — Other Ambulatory Visit: Payer: Self-pay

## 2024-05-26 DIAGNOSIS — R519 Headache, unspecified: Secondary | ICD-10-CM | POA: Insufficient documentation

## 2024-05-26 MED ORDER — KETOROLAC TROMETHAMINE 15 MG/ML IJ SOLN
15.0000 mg | Freq: Once | INTRAMUSCULAR | Status: AC
  Administered 2024-05-26: 15 mg via INTRAMUSCULAR
  Filled 2024-05-26: qty 1

## 2024-05-26 MED ORDER — IBUPROFEN 800 MG PO TABS: 800.0000 mg | ORAL_TABLET | Freq: Three times a day (TID) | ORAL | 0 refills | Status: AC

## 2024-05-26 NOTE — Discharge Instructions (Signed)
 Take 4 over the counter ibuprofen  tablets 3 times a day or 2 over-the-counter naproxen  tablets twice a day for pain. Also take tylenol  1000mg (2 extra strength) four times a day.    Follow up with your family doc in the office.  Please return for sudden worsening pain one-sided numbness or weakness or difficulty speech or swelling.

## 2024-05-26 NOTE — ED Triage Notes (Signed)
 Patient to ED by POV with c/o HA. States pain is in the back left side of head and radiates into her neck. She denies N/V or blurred vision.

## 2024-05-26 NOTE — ED Provider Notes (Signed)
 [place] EMERGENCY DEPARTMENT AT [facility] Provider Note   CSN: [id number] Arrival date & time: 05/26/24  1851     Patient presents with: Migraine   Lisa Hickman is a 33 y.o. female.   33 yo F with a cc of headache.  Patient's been having pain to the back of her head to the base of the neck.  Going on for a day or so.  She is worried because her mom gets migraines and she was worried that maybe this would be a migraine.  Pain seems to come and go.  Feels like her braids are tied too tight.  She denies recent changes to her hair.  Denies trauma denies one-sided numbness or weakness or difficulty speech or swallowing.  Denies cough congestion or fever.   Migraine       Prior to Admission medications   Medication Sig Start Date End Date Taking? Authorizing Provider  amoxicillin -clavulanate (AUGMENTIN ) 875-125 MG tablet Take 1 tablet by mouth every 12 (twelve) hours. 09/18/23   Raspet, Erin K, PA-C  cyclobenzaprine  (FLEXERIL ) 5 MG tablet Take 1 tablet (5 mg total) by mouth 3 (three) times daily as needed. 11/24/23   Small, Brooke L, PA  ketorolac  (TORADOL ) 10 MG tablet Take 1 tablet (10 mg total) by mouth every 6 (six) hours as needed (pain). 09/18/23   Raspet, Rocky POUR, PA-C  lidocaine  (XYLOCAINE ) 2 % solution Use as directed 15 mLs in the mouth or throat every 4 (four) hours as needed for mouth pain. 09/18/23   Raspet, Erin K, PA-C    Allergies: Georgianne cerise allergy] and Vicodin [hydrocodone -acetaminophen ]    Review of Systems  Updated Vital Signs BP 127/81   Pulse 78   Temp 98.1 F (36.7 C) (Oral)   Resp 18   Ht 5' 8 (1.727 m)   Wt 106.6 kg   LMP 05/26/2024   SpO2 100%   BMI 35.73 kg/m   Physical Exam Vitals and nursing note reviewed.  Constitutional:      General: She is not in acute distress.    Appearance: She is well-developed. She is not diaphoretic.  HENT:     Head: Normocephalic and atraumatic.  Eyes:     Pupils: Pupils are equal,  round, and reactive to light.  Cardiovascular:     Rate and Rhythm: Normal rate and regular rhythm.     Heart sounds: No murmur heard.    No friction rub. No gallop.  Pulmonary:     Effort: Pulmonary effort is normal.     Breath sounds: No wheezing or rales.  Abdominal:     General: There is no distension.     Palpations: Abdomen is soft.     Tenderness: There is no abdominal tenderness.  Musculoskeletal:        General: No tenderness.     Cervical back: Normal range of motion and neck supple.  Skin:    General: Skin is warm and dry.  Neurological:     Mental Status: She is alert and oriented to person, place, and time.     Cranial Nerves: Cranial nerves 2-12 are intact.     Sensory: Sensation is intact.     Motor: Motor function is intact.     Coordination: Coordination is intact.     Comments: Benign neuro exam  Tightness to the trapezius muscles at the base of the occiput bilaterally  Psychiatric:        Behavior: Behavior normal.     (  all labs ordered are listed, but only abnormal results are displayed) Labs Reviewed - No data to display  EKG: None  Radiology: No results found.   Procedures   Medications Ordered in the ED  ketorolac  (TORADOL ) 15 MG/ML injection 15 mg (has no administration in time range)                                    Medical Decision Making Risk Prescription drug management.   33 yO F with a chief complaint of an occipital headache.  Tension headache by history.  Will give a dose of Toradol  here.  PCP follow-up.  9:22 PM:  I have discussed the diagnosis/risks/treatment options with the patient.  Evaluation and diagnostic testing in the emergency department does not suggest an emergent condition requiring admission or immediate intervention beyond what has been performed at this time.  They will follow up with PCP. We also discussed returning to the ED immediately if new or worsening sx occur. We discussed the sx which are most  concerning (e.g., sudden worsening pain, fever, inability to tolerate by mouth, stroke s/sx) that necessitate immediate return. Medications administered to the patient during their visit and any new prescriptions provided to the patient are listed below.  Medications given during this visit Medications  ketorolac  (TORADOL ) 15 MG/ML injection 15 mg (has no administration in time range)     The patient appears reasonably screen and/or stabilized for discharge and I doubt any other medical condition or other [facility] requiring further screening, evaluation, or treatment in the ED at this time prior to discharge.       Final diagnoses:  Occipital headache    ED Discharge Orders     None          Emil Share, DO 05/26/24 2122

## 2024-05-27 NOTE — ED Notes (Signed)
 Pt seen leaving to lobby with friend

## 2024-06-10 ENCOUNTER — Emergency Department (HOSPITAL_COMMUNITY)

## 2024-06-10 ENCOUNTER — Other Ambulatory Visit: Payer: Self-pay

## 2024-06-10 ENCOUNTER — Emergency Department (HOSPITAL_COMMUNITY)
Admission: EM | Admit: 2024-06-10 | Discharge: 2024-06-10 | Disposition: A | Discharge status: Home / Self Care | Attending: Emergency Medicine | Admitting: Emergency Medicine

## 2024-06-10 DIAGNOSIS — R42 Dizziness and giddiness: Secondary | ICD-10-CM | POA: Diagnosis not present

## 2024-06-10 DIAGNOSIS — S0991XA Unspecified injury of ear, initial encounter: Secondary | ICD-10-CM | POA: Diagnosis present

## 2024-06-10 DIAGNOSIS — R519 Headache, unspecified: Secondary | ICD-10-CM | POA: Insufficient documentation

## 2024-06-10 DIAGNOSIS — S01312A Laceration without foreign body of left ear, initial encounter: Secondary | ICD-10-CM | POA: Insufficient documentation

## 2024-06-10 DIAGNOSIS — R531 Weakness: Secondary | ICD-10-CM | POA: Diagnosis not present

## 2024-06-10 DIAGNOSIS — S0990XA Unspecified injury of head, initial encounter: Secondary | ICD-10-CM

## 2024-06-10 LAB — PREGNANCY, URINE: Preg Test, Ur: NEGATIVE

## 2024-06-10 MED ORDER — LIDOCAINE HCL (PF) 1 % IJ SOLN
5.0000 mL | Freq: Once | INTRAMUSCULAR | Status: AC
  Administered 2024-06-10: 5 mL
  Filled 2024-06-10: qty 30

## 2024-06-10 MED ORDER — ACETAMINOPHEN 325 MG PO TABS
650.0000 mg | ORAL_TABLET | Freq: Once | ORAL | Status: AC
  Administered 2024-06-10: 650 mg via ORAL
  Filled 2024-06-10: qty 2

## 2024-06-10 MED ORDER — OXYCODONE HCL 5 MG PO TABS
5.0000 mg | ORAL_TABLET | Freq: Once | ORAL | Status: AC
  Administered 2024-06-10: 5 mg via ORAL
  Filled 2024-06-10: qty 1
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
## 2024-06-10 NOTE — Discharge Instructions (Addendum)
 You have been evaluated for your head injury.  Fortunately CT scan did not show any concerning finding.  You have a small laceration to your left ear that was repaired using absorbable sutures.  You may take over-the-counter Tylenol  ibuprofen  as needed for pain.  Follow-up with your doctor for further care.
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
## 2024-06-10 NOTE — ED Notes (Signed)
 Non adaptive dressing applied on the pt's left ear
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
## 2024-08-27 ENCOUNTER — Emergency Department (HOSPITAL_COMMUNITY): Payer: Self-pay

## 2024-08-27 ENCOUNTER — Other Ambulatory Visit: Payer: Self-pay

## 2024-08-27 ENCOUNTER — Emergency Department (HOSPITAL_COMMUNITY)
Admission: EM | Admit: 2024-08-27 | Discharge: 2024-08-27 | Disposition: A | Discharge status: Home / Self Care | Payer: Self-pay | Attending: Emergency Medicine | Admitting: Emergency Medicine

## 2024-08-27 ENCOUNTER — Encounter (HOSPITAL_COMMUNITY): Payer: Self-pay

## 2024-08-27 DIAGNOSIS — S8011XA Contusion of right lower leg, initial encounter: Secondary | ICD-10-CM | POA: Diagnosis not present

## 2024-08-27 DIAGNOSIS — M546 Pain in thoracic spine: Secondary | ICD-10-CM | POA: Insufficient documentation

## 2024-08-27 DIAGNOSIS — M79651 Pain in right thigh: Secondary | ICD-10-CM | POA: Diagnosis present

## 2024-08-27 DIAGNOSIS — Y92410 Unspecified street and highway as the place of occurrence of the external cause: Secondary | ICD-10-CM | POA: Diagnosis not present

## 2024-08-27 DIAGNOSIS — S0093XA Contusion of unspecified part of head, initial encounter: Secondary | ICD-10-CM | POA: Insufficient documentation

## 2024-08-27 LAB — CBC
HCT: 38.1 % (ref 36.0–46.0)
Hemoglobin: 12 g/dL (ref 12.0–15.0)
MCH: 28.8 pg (ref 26.0–34.0)
MCHC: 31.5 g/dL (ref 30.0–36.0)
MCV: 91.6 fL (ref 80.0–100.0)
Platelets: 337 K/uL (ref 150–400)
RBC: 4.16 MIL/uL (ref 3.87–5.11)
RDW: 12.8 % (ref 11.5–15.5)
WBC: 10.9 K/uL — ABNORMAL HIGH (ref 4.0–10.5)
nRBC: 0 % (ref 0.0–0.2)

## 2024-08-27 LAB — COMPREHENSIVE METABOLIC PANEL WITH GFR
ALT: 15 U/L (ref 0–44)
AST: 17 U/L (ref 15–41)
Albumin: 3.1 g/dL — ABNORMAL LOW (ref 3.5–5.0)
Alkaline Phosphatase: 75 U/L (ref 38–126)
Anion gap: 12 (ref 5–15)
BUN: 7 mg/dL (ref 6–20)
CO2: 21 mmol/L — ABNORMAL LOW (ref 22–32)
Calcium: 8 mg/dL — ABNORMAL LOW (ref 8.9–10.3)
Chloride: 103 mmol/L (ref 98–111)
Creatinine, Ser: 0.91 mg/dL (ref 0.44–1.00)
GFR, Estimated: >60 mL/min
Glucose, Bld: 76 mg/dL (ref 70–99)
Potassium: 3.5 mmol/L (ref 3.5–5.1)
Sodium: 136 mmol/L (ref 135–145)
Total Bilirubin: 0.4 mg/dL (ref 0.0–1.2)
Total Protein: 6.4 g/dL — ABNORMAL LOW (ref 6.5–8.1)

## 2024-08-27 LAB — I-STAT CHEM 8, ED
BUN: 7 mg/dL (ref 6–20)
Calcium, Ion: 1.1 mmol/L — ABNORMAL LOW (ref 1.15–1.40)
Chloride: 103 mmol/L (ref 98–111)
Creatinine, Ser: 1 mg/dL (ref 0.44–1.00)
Glucose, Bld: 73 mg/dL (ref 70–99)
HCT: 36 % (ref 36.0–46.0)
Hemoglobin: 12.2 g/dL (ref 12.0–15.0)
Potassium: 3.5 mmol/L (ref 3.5–5.1)
Sodium: 140 mmol/L (ref 135–145)
TCO2: 24 mmol/L (ref 22–32)

## 2024-08-27 LAB — URINALYSIS, ROUTINE W REFLEX MICROSCOPIC
Bacteria, UA: NONE SEEN
Bilirubin Urine: NEGATIVE
Glucose, UA: NEGATIVE mg/dL
Ketones, ur: NEGATIVE mg/dL
Leukocytes,Ua: NEGATIVE
Nitrite: NEGATIVE
Protein, ur: NEGATIVE mg/dL
Specific Gravity, Urine: 1.039 — ABNORMAL HIGH (ref 1.005–1.030)
pH: 6 (ref 5.0–8.0)

## 2024-08-27 LAB — SAMPLE TO BLOOD BANK

## 2024-08-27 LAB — HCG, SERUM, QUALITATIVE: Preg, Serum: NEGATIVE

## 2024-08-27 LAB — I-STAT CG4 LACTIC ACID, ED: Lactic Acid, Venous: 2.2 mmol/L (ref 0.5–1.9)

## 2024-08-27 LAB — PROTIME-INR
INR: 1 (ref 0.8–1.2)
Prothrombin Time: 13.5 s (ref 11.4–15.2)

## 2024-08-27 LAB — ETHANOL: Alcohol, Ethyl (B): 86 mg/dL — ABNORMAL HIGH

## 2024-08-27 MED ORDER — IOHEXOL 350 MG/ML SOLN
75.0000 mL | Freq: Once | INTRAVENOUS | Status: AC | PRN
  Administered 2024-08-27 (×2): 75 mL via INTRAVENOUS

## 2024-08-27 MED ORDER — FENTANYL CITRATE (PF) 50 MCG/ML IJ SOSY
PREFILLED_SYRINGE | INTRAMUSCULAR | Status: AC
  Administered 2024-08-27 (×2): 100 ug
  Filled 2024-08-27: qty 2

## 2024-08-27 MED ORDER — FENTANYL CITRATE (PF) 100 MCG/2ML IJ SOLN
INTRAMUSCULAR | Status: AC | PRN
  Administered 2024-08-27 (×2): 100 ug via INTRAVENOUS

## 2024-08-27 MED ORDER — OXYCODONE HCL 5 MG PO TABS
5.0000 mg | ORAL_TABLET | Freq: Four times a day (QID) | ORAL | Status: DC | PRN
  Administered 2024-08-27 (×2): 5 mg via ORAL
  Filled 2024-08-27: qty 1

## 2024-08-27 MED ORDER — IBUPROFEN 800 MG PO TABS
800.0000 mg | ORAL_TABLET | Freq: Three times a day (TID) | ORAL | Status: DC | PRN
  Filled 2024-08-27: qty 1

## 2024-08-27 MED ORDER — OXYCODONE HCL 5 MG PO CAPS: 5.0000 mg | ORAL_CAPSULE | Freq: Three times a day (TID) | ORAL | 0 refills | Status: AC | PRN

## 2024-08-27 NOTE — ED Notes (Signed)
 Lactic results abnormal to crystal b.rn by at
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
## 2024-08-27 NOTE — ED Notes (Signed)
C-collar removed with permission of MD.

## 2024-08-27 NOTE — ED Notes (Signed)
 CCMD called.
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
## 2024-08-27 NOTE — Progress Notes (Signed)
 Orthopedic Tech Progress Note Patient Details:  Lisa Hickman Apr 22, 1991 [id number]  Ortho Devices Type of Ortho Device: Crutches Ortho Device/Splint Location: RLE Ortho Device/Splint Interventions: Ordered, Application, Adjustment   Post Interventions Patient Tolerated: Well Instructions Provided: Care of device  Zissy Hamlett L Sorrel Cassetta 08/27/2024, 10:16 PM

## 2024-08-28 ENCOUNTER — Encounter (HOSPITAL_COMMUNITY): Payer: Self-pay
